# Patient Record
Sex: Male | Born: 1980 | Race: Black or African American | Hispanic: No | Marital: Single | Smoking: Current every day smoker
Health system: Southern US, Community
[De-identification: ages and names within clinical notes are randomized; demographics above are authoritative.]

## PROBLEM LIST (undated history)

## (undated) DIAGNOSIS — N200 Calculus of kidney: Secondary | ICD-10-CM

## (undated) DIAGNOSIS — I10 Essential (primary) hypertension: Secondary | ICD-10-CM

## (undated) DIAGNOSIS — K8020 Calculus of gallbladder without cholecystitis without obstruction: Secondary | ICD-10-CM

---

## 2000-09-19 ENCOUNTER — Emergency Department (HOSPITAL_COMMUNITY): Admission: EM | Admit: 2000-09-19 | Discharge: 2000-09-19 | Payer: Self-pay | Admitting: Emergency Medicine

## 2016-08-09 ENCOUNTER — Ambulatory Visit (INDEPENDENT_AMBULATORY_CARE_PROVIDER_SITE_OTHER): Payer: Self-pay | Admitting: Physician Assistant

## 2018-07-17 ENCOUNTER — Emergency Department (HOSPITAL_COMMUNITY): Payer: Self-pay

## 2018-07-17 ENCOUNTER — Other Ambulatory Visit: Payer: Self-pay

## 2018-07-17 ENCOUNTER — Encounter (HOSPITAL_COMMUNITY): Payer: Self-pay | Admitting: Emergency Medicine

## 2018-07-17 ENCOUNTER — Emergency Department (HOSPITAL_COMMUNITY)
Admission: EM | Admit: 2018-07-17 | Discharge: 2018-07-17 | Disposition: A | Discharge status: Home / Self Care | Payer: Self-pay | Attending: Emergency Medicine | Admitting: Emergency Medicine

## 2018-07-17 DIAGNOSIS — R300 Dysuria: Secondary | ICD-10-CM | POA: Insufficient documentation

## 2018-07-17 DIAGNOSIS — N433 Hydrocele, unspecified: Secondary | ICD-10-CM | POA: Insufficient documentation

## 2018-07-17 DIAGNOSIS — R3915 Urgency of urination: Secondary | ICD-10-CM | POA: Insufficient documentation

## 2018-07-17 DIAGNOSIS — N451 Epididymitis: Secondary | ICD-10-CM | POA: Insufficient documentation

## 2018-07-17 DIAGNOSIS — F1990 Other psychoactive substance use, unspecified, uncomplicated: Secondary | ICD-10-CM | POA: Insufficient documentation

## 2018-07-17 DIAGNOSIS — F17210 Nicotine dependence, cigarettes, uncomplicated: Secondary | ICD-10-CM | POA: Insufficient documentation

## 2018-07-17 DIAGNOSIS — F1290 Cannabis use, unspecified, uncomplicated: Secondary | ICD-10-CM | POA: Insufficient documentation

## 2018-07-17 DIAGNOSIS — I10 Essential (primary) hypertension: Secondary | ICD-10-CM | POA: Insufficient documentation

## 2018-07-17 DIAGNOSIS — R369 Urethral discharge, unspecified: Secondary | ICD-10-CM | POA: Insufficient documentation

## 2018-07-17 HISTORY — DX: Essential (primary) hypertension: I10

## 2018-07-17 LAB — URINALYSIS, ROUTINE W REFLEX MICROSCOPIC
Bilirubin Urine: NEGATIVE
Glucose, UA: NEGATIVE mg/dL
Ketones, ur: NEGATIVE mg/dL
Nitrite: NEGATIVE
Protein, ur: NEGATIVE mg/dL
Specific Gravity, Urine: 1.019 (ref 1.005–1.030)
WBC, UA: >50 WBC/hpf — ABNORMAL HIGH (ref 0–5)
pH: 6 (ref 5.0–8.0)

## 2018-07-17 MED ORDER — CEFTRIAXONE SODIUM 250 MG IJ SOLR
250.0000 mg | Freq: Once | INTRAMUSCULAR | Status: AC
  Administered 2018-07-17: 250 mg via INTRAMUSCULAR
  Filled 2018-07-17: qty 250

## 2018-07-17 MED ORDER — ACETAMINOPHEN 500 MG PO TABS
1000.0000 mg | ORAL_TABLET | Freq: Once | ORAL | Status: AC
  Administered 2018-07-17: 1000 mg via ORAL
  Filled 2018-07-17: qty 2

## 2018-07-17 MED ORDER — DOXYCYCLINE HYCLATE 100 MG PO TABS
100.0000 mg | ORAL_TABLET | Freq: Once | ORAL | Status: AC
  Administered 2018-07-17: 11:00:00 100 mg via ORAL
  Filled 2018-07-17: qty 1

## 2018-07-17 MED ORDER — LIDOCAINE HCL (PF) 1 % IJ SOLN
INTRAMUSCULAR | Status: AC
  Administered 2018-07-17: 11:00:00
  Filled 2018-07-17: qty 5

## 2018-07-17 MED ORDER — DOXYCYCLINE HYCLATE 100 MG PO CAPS: 100.0000 mg | ORAL_CAPSULE | Freq: Two times a day (BID) | ORAL | 0 refills | Status: DC

## 2018-07-17 NOTE — ED Provider Notes (Signed)
[facility] Provider Note   CSN: [id number] Arrival date & time: 07/17/18  [id number]     History   Chief Complaint Chief Complaint  Patient presents with  . Testicle Pain    HPI Anthony Preston is a 38 y.o. male.     HPI  Patient is a 37-year male with past medical history of hypertension presenting for right scrotal swelling and pain.  He reports that his right scrotum has hurt for approximately a week.  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  He also reports dysuria, urgency, and penile discharge.  Patient reports that he has a new sexual partner within the last month.  He reports sexual intercourse with male partners, denies any penetrative anal intercourse.  He denies any fever, chills, nausea, vomiting, rectal pain or swelling.  Patient ports he does have a history of STI.    Past Medical History:  Diagnosis Date  . Hypertension     There are no active problems to display for this patient.   History reviewed. No pertinent surgical history.      Home Medications    Prior to Admission medications   Medication Sig Start Date End Date Taking? Authorizing Provider  doxycycline (VIBRAMYCIN) 100 MG capsule Take 1 capsule (100 mg total) by mouth 2 (two) times daily. 07/17/18   Elisha PonderMurray, Spruha Weight B, PA-C    Family History History reviewed. No pertinent family history.  Social History Social History   Tobacco Use  . Smoking status: Current Every Day Smoker  . Smokeless tobacco: Never Used  Substance Use Topics  . Alcohol use: Yes  . Drug use: Yes    Types: Methamphetamines, Marijuana     Allergies   Patient has no known allergies.   Review of Systems Review of Systems  Constitutional: Negative for chills and fever.  Gastrointestinal: Negative for abdominal pain, nausea and vomiting.  Genitourinary: Positive for discharge, dysuria, scrotal swelling, testicular pain and urgency. Negative for  flank pain.     Physical Exam Updated Vital Signs BP (!) 148/112 (BP Location: Right Arm)   Pulse 90   Temp 98.3 F (36.8 C) (Oral)   Resp 18   Ht 5\' 5"  (1.651 m)   Wt 70.3 kg   SpO2 100%   BMI 25.79 kg/m   Physical Exam Vitals signs and nursing note reviewed.  Constitutional:      General: He is not in acute distress.    Appearance: He is well-developed. He is not diaphoretic.     Comments: Sitting comfortably in bed.  HENT:     Head: Normocephalic and atraumatic.  Eyes:     General:        Right eye: No discharge.        Left eye: No discharge.     Conjunctiva/sclera: Conjunctivae normal.     Comments: EOMs normal to gross examination.  Neck:     Musculoskeletal: Normal range of motion.  Cardiovascular:     Rate and Rhythm: Normal rate and regular rhythm.     Comments: Intact, 2+ radial pulse. Pulmonary:     Comments: Converses comfortably.  No audible wheeze or stridor. Abdominal:     General: There is no distension.  Genitourinary:    Comments: GU examination performed with RN chaperone present.  Patient has an edematous and painful right scrotum.  It is nonerythematous.  Difficult to elicit cremasteric reflex on right.  Left testicle not swollen or erythematous.  Cremasteric reflex intact on left scrotum. Musculoskeletal: Normal range of motion.  Skin:    General: Skin is warm and dry.  Neurological:     Mental Status: He is alert.     Comments: Cranial nerves intact to gross observation. Patient moves extremities without difficulty.  Psychiatric:        Behavior: Behavior normal.        Thought Content: Thought content normal.        Judgment: Judgment normal.      ED Treatments / Results  Labs (all labs ordered are listed, but only abnormal results are displayed) Labs Reviewed  URINALYSIS, ROUTINE W REFLEX MICROSCOPIC - Abnormal; Notable for the following components:      Result Value   APPearance HAZY (*)    Hgb urine dipstick SMALL (*)     Leukocytes,Ua LARGE (*)    WBC, UA >50 (*)    Bacteria, UA RARE (*)    All other components within normal limits  URINE CULTURE  RPR  HIV ANTIBODY (ROUTINE TESTING W REFLEX)  GC/CHLAMYDIA PROBE AMP ([place] NOT [facility]    EKG    Radiology [place] Scrotum W/doppler  Result Date: 07/17/2018 CLINICAL DATA:  Right testicular swelling. EXAM: SCROTAL ULTRASOUND DOPPLER ULTRASOUND OF THE TESTICLES TECHNIQUE: Complete ultrasound examination of the testicles, epididymis, and other scrotal structures was performed. Color and spectral Doppler ultrasound were also utilized to evaluate blood flow to the testicles. COMPARISON:  None. FINDINGS: Right testicle Measurements: 4.5 x 3.0 x 2.8 cm. No mass or microlithiasis visualized. Left testicle Measurements: 4.8 x 2.8 x 2.7 cm. No mass or microlithiasis visualized. Right epididymis: Enlarged and slightly hypervascular suggesting epididymitis. Left epididymis:  Normal in size and appearance. Hydrocele:  Mild right hydrocele is noted. Varicocele:  None visualized. Pulsed Doppler interrogation of both testes demonstrates normal low resistance arterial and venous waveforms bilaterally. IMPRESSION: No evidence of testicular mass or torsion. Enlargement of right epididymis is noted suggesting epididymitis. Mild right hydrocele is noted. Short-term follow-up ultrasound is recommended to ensure resolution. Electronically Signed   By: Lupita RaiderJames  Green Jr M.D.   On: 07/17/2018 10:06    Procedures Procedures (including critical care time)  Medications Ordered in ED Medications  lidocaine (PF) (XYLOCAINE) 1 % injection (has no administration in time range)  acetaminophen (TYLENOL) tablet 1,000 mg (1,000 mg Oral Given 07/17/18 1115)  cefTRIAXone (ROCEPHIN) injection 250 mg (250 mg Intramuscular Given 07/17/18 1115)  doxycycline (VIBRA-TABS) tablet 100 mg (100 mg Oral Given 07/17/18 1115)     Initial Impression / Assessment and Plan / ED Course  I have reviewed the  triage vital signs and the nursing notes.  Pertinent labs & imaging results that were available during my care of the patient were reviewed by me and considered in my medical decision making (see chart for details).        This is a well-appearing 38 year old male with past medical history of hypertension presenting for right scrotal pain and swelling.  Differential diagnosis includes epididymitis versus torsion versus hydrocele.  Clinically, history not consistent with torsion.  More consistent with epididymitis.  Urinalysis confirming infection, likely caused by STI, and Doppler scrotum demonstrates no evidence of torsion but does show hyperemia consistent with epididymitis.  He also has a mild hydrocele likely reactive, but follow-up ultrasound recommended.  Patient was also counseled against substance use and the deleterious effects of meth on the vasculature.  He states that he only uses this occasionally.  He reports that  he is willing and able to quit.  Patient treated with Rocephin and doxycycline.  He is instructed to follow-up with urology.  He is also instructed to obtain recheck for high blood pressure.  Return precautions given for any increasing pain, swelling, nausea, vomiting, rectal pain.  Patient is in understanding and agrees with plan of care.  Final Clinical Impressions(s) / ED Diagnoses   Final diagnoses:  Epididymitis  Hydrocele, unspecified hydrocele type    ED Discharge Orders         Ordered    doxycycline (VIBRAMYCIN) 100 MG capsule  2 times daily     07/17/18 [address] PA-C 07/17/18 [place], [place], DO 07/17/18 1354

## 2018-07-17 NOTE — ED Triage Notes (Addendum)
Pt arrives to ED from home with complaints of testicle swelling, painful and longer lasting erections, and brown discharge for over a week. Pt states he has a new partner but his last partner may of had something. Pt states he does meth before he has intercourse which causes his longer erections and he thinks it causing his "blue balls."

## 2018-07-17 NOTE — ED Notes (Signed)
Patient verbalizes understanding of discharge instructions. Opportunity for questioning and answers were provided. Armband removed by staff, pt discharged from ED.  

## 2018-07-17 NOTE — Discharge Instructions (Signed)
Please see the information and instructions below regarding your visit.  Your diagnoses today include:  1. Epididymitis   2. Hydrocele, unspecified hydrocele type     Tests performed today include: See side panel of your discharge paperwork for testing performed today. Vital signs are listed at the bottom of these instructions.   We tested for HIV and syphilis today.  These results will be available in 48 to 72 hours.  You can find them on your MyChart, or you will receive a call for any positive results.  You will not receive a call for any negative results.   Medications prescribed:    Take any prescribed medications only as prescribed, and any over the counter medications only as directed on the packaging.  Doxycycline is an antibiotic that fights infection in the testicle. This medication can make your skin sensitive to the sun, so please ensure that you wear sunscreen, hats, or other coverage over your skin while taking this. This medicine CANNOT be taken by women while pregnant, breastfeeding, or trying to become pregnant.  Please speak with a healthcare provider if any of these situations apply to you.  You may take Tylenol, [phone] mg every 6 hours as needed for pain.  Home care instructions:  Please follow any educational materials contained in this packet.   Your partner must get tested and treated.  Follow-up instructions: Please follow-up with Dr. Junious Silk of urology.   Return instructions:  Please return to the Emergency Department if you experience worsening symptoms.  Please come back to the ER if you develop any worsening pain, swelling, nausea, vomiting, fever, or rectal pain. Please return if you have any other emergent concerns.  Additional Information:   Your vital signs today were: BP (!) 148/112 (BP Location: Right Arm)    Pulse 90    Temp 98.3 F (36.8 C) (Oral)    Resp 18    Ht 5\' 5"  (1.651 m)    Wt 70.3 kg    SpO2 100%    BMI 25.79 kg/m  If your  blood pressure (BP) was elevated on multiple readings during this visit above 130 for the top number or above 80 for the bottom number, please have this repeated by your primary care provider within one month. --------------  Thank you for allowing [place] to participate in your care today.

## 2018-07-18 LAB — HIV ANTIBODY (ROUTINE TESTING W REFLEX): HIV Screen 4th Generation wRfx: NONREACTIVE

## 2018-07-18 LAB — URINE CULTURE: Culture: NO GROWTH

## 2018-07-18 LAB — GC/CHLAMYDIA PROBE AMP (~~LOC~~) NOT AT ARMC
Chlamydia: NEGATIVE
Neisseria Gonorrhea: NEGATIVE

## 2018-07-19 LAB — RPR: RPR Ser Ql: NONREACTIVE

## 2018-09-23 ENCOUNTER — Emergency Department (HOSPITAL_COMMUNITY)
Admission: EM | Admit: 2018-09-23 | Discharge: 2018-09-23 | Disposition: A | Discharge status: Home / Self Care | Payer: Self-pay | Attending: Emergency Medicine | Admitting: Emergency Medicine

## 2018-09-23 DIAGNOSIS — F1210 Cannabis abuse, uncomplicated: Secondary | ICD-10-CM | POA: Insufficient documentation

## 2018-09-23 DIAGNOSIS — S61216A Laceration without foreign body of right little finger without damage to nail, initial encounter: Secondary | ICD-10-CM | POA: Insufficient documentation

## 2018-09-23 DIAGNOSIS — Y281XXA Contact with knife, undetermined intent, initial encounter: Secondary | ICD-10-CM | POA: Insufficient documentation

## 2018-09-23 DIAGNOSIS — Y999 Unspecified external cause status: Secondary | ICD-10-CM | POA: Insufficient documentation

## 2018-09-23 DIAGNOSIS — I10 Essential (primary) hypertension: Secondary | ICD-10-CM | POA: Insufficient documentation

## 2018-09-23 DIAGNOSIS — F17210 Nicotine dependence, cigarettes, uncomplicated: Secondary | ICD-10-CM | POA: Insufficient documentation

## 2018-09-23 DIAGNOSIS — Z23 Encounter for immunization: Secondary | ICD-10-CM | POA: Insufficient documentation

## 2018-09-23 DIAGNOSIS — Y939 Activity, unspecified: Secondary | ICD-10-CM | POA: Insufficient documentation

## 2018-09-23 DIAGNOSIS — Y929 Unspecified place or not applicable: Secondary | ICD-10-CM | POA: Insufficient documentation

## 2018-09-23 MED ORDER — TETANUS-DIPHTH-ACELL PERTUSSIS 5-2.5-18.5 LF-MCG/0.5 IM SUSP
0.5000 mL | Freq: Once | INTRAMUSCULAR | Status: AC
  Administered 2018-09-23: 15:00:00 0.5 mL via INTRAMUSCULAR
  Filled 2018-09-23: qty 0.5

## 2018-09-23 MED ORDER — LIDOCAINE HCL (PF) 1 % IJ SOLN
10.0000 mL | Freq: Once | INTRAMUSCULAR | Status: AC
  Administered 2018-09-23: 10 mL
  Filled 2018-09-23: qty 30

## 2018-09-23 NOTE — ED Provider Notes (Signed)
[facility]-EMERGENCY DEPT Provider Note   CSN: [id number] Arrival date & time: 09/23/18  1234     History   Chief Complaint Chief Complaint  Patient presents with  . Laceration    HPI Anthony Preston is a 38 y.o. male.     Patient is a 38 year old male with past medical history of hypertension presenting to the emergency department for a laceration.  Patient reports just prior to arrival he was reaching into his backpack with his right hand when he cut his right little finger on a knife which was opened in his backpack.  Reports having pain to his finger.  Patient called EMS and was brought in.  Patient reports that he recently had a tetanus shot in the last couple of years.     Past Medical History:  Diagnosis Date  . Hypertension     There are no active problems to display for this patient.   No past surgical history on file.      Home Medications    Prior to Admission medications   Medication Sig Start Date End Date Taking? Authorizing Provider  doxycycline (VIBRAMYCIN) 100 MG capsule Take 1 capsule (100 mg total) by mouth 2 (two) times daily. 07/17/18   Elisha PonderMurray, Alyssa B, PA-C    Family History No family history on file.  Social History Social History   Tobacco Use  . Smoking status: Current Every Day Smoker  . Smokeless tobacco: Never Used  Substance Use Topics  . Alcohol use: Yes  . Drug use: Yes    Types: Methamphetamines, Marijuana     Allergies   Patient has no known allergies.   Review of Systems Review of Systems  Constitutional: Negative for chills and fever.  Skin: Positive for wound.  Allergic/Immunologic: Negative for immunocompromised state.  Hematological: Does not bruise/bleed easily.  All other systems reviewed and are negative.    Physical Exam Updated Vital Signs BP 122/87   Pulse (!) 112   Temp (!) 97.4 F (36.3 C) (Oral)   Resp 18   SpO2 100%   Physical Exam Vitals signs and nursing note  reviewed.  Constitutional:      General: He is not in acute distress.    Appearance: Normal appearance. He is not ill-appearing, toxic-appearing or diaphoretic.  HENT:     Head: Normocephalic and atraumatic.  Eyes:     Conjunctiva/sclera: Conjunctivae normal.  Pulmonary:     Effort: Pulmonary effort is normal.  Musculoskeletal:     Comments: Small 1 cm laceration to the distal lateral portion of the finger without involvement of the nail.  Bleeding is controlled.  Range of motion and sensation are intact  Skin:    General: Skin is dry.     Capillary Refill: Capillary refill takes less than 2 seconds.  Neurological:     Mental Status: He is alert.  Psychiatric:        Mood and Affect: Mood normal.      ED Treatments / Results  Labs (all labs ordered are listed, but only abnormal results are displayed) Labs Reviewed - No data to display  EKG None  Radiology No results found.  Procedures .Marland Kitchen.Laceration Repair  Date/Time: 09/23/2018 2:27 PM Performed by: Arlyn DunningMcLean, Eliud Polo A, PA-C Authorized by: Arlyn DunningMcLean, Dorena Dorfman A, PA-C   Consent:    Consent obtained:  Verbal   Consent given by:  Patient   Risks discussed:  Infection, pain, poor cosmetic result, need for additional repair, nerve damage, tendon damage  and poor wound healing   Alternatives discussed:  No treatment and observation Anesthesia (see MAR for exact dosages):    Anesthesia method:  Nerve block   Block location:  Right little finger digital block   Block needle gauge:  25 G   Block anesthetic:  Lidocaine 1% w/o epi   Block injection procedure:  Anatomic landmarks identified, anatomic landmarks palpated, introduced needle, negative aspiration for blood and incremental injection   Block outcome:  Anesthesia achieved Laceration details:    Location:  Finger   Finger location:  R small finger   Length (cm):  1 Repair type:    Repair type:  Simple Pre-procedure details:    Preparation:  Patient was prepped and draped in  usual sterile fashion Exploration:    Hemostasis achieved with:  Direct pressure   Wound exploration: wound explored through full range of motion     Contaminated: no   Treatment:    Area cleansed with:  Betadine and saline   Amount of cleaning:  Standard   Irrigation solution:  Sterile saline   Irrigation method:  Syringe Skin repair:    Repair method:  Sutures   Suture size:  4-0   Suture material:  Nylon   Suture technique:  Simple interrupted   Number of sutures:  5 Approximation:    Approximation:  Close Post-procedure details:    Dressing:  Non-adherent dressing   Patient tolerance of procedure:  Tolerated well, no immediate complications   (including critical care time)  Medications Ordered in ED Medications  Tdap (BOOSTRIX) injection 0.5 mL (has no administration in time range)  lidocaine (PF) (XYLOCAINE) 1 % injection 10 mL (10 mLs Infiltration Given by Other 09/23/18 1401)     Initial Impression / Assessment and Plan / ED Course  I have reviewed the triage vital signs and the nursing notes.  Pertinent labs & imaging results that were available during my care of the patient were reviewed by me and considered in my medical decision making (see chart for details).          Final Clinical Impressions(s) / ED Diagnoses   Final diagnoses:  Laceration of right little finger without foreign body without damage to nail, initial encounter    ED Discharge Orders    None       Kristine Royal 09/23/18 1428    Little, Wenda Overland, MD 09/24/18 [id number]

## 2018-09-23 NOTE — ED Triage Notes (Signed)
Per [facility] pt from hotel for laceration on 5th finger on right side with knife that was open in his bag.  Vitals: 120/70, 125HR, 22R, 96% on RA 97 temp.

## 2018-09-23 NOTE — Discharge Instructions (Addendum)
Thank you for allowing me to care for you today. Please return to the emergency department if you have new or worsening symptoms. Take your medications as instructed.  ° °

## 2018-09-23 NOTE — ED Notes (Signed)
PA at bedside, unable to complete triage at this time.

## 2018-12-01 ENCOUNTER — Emergency Department (HOSPITAL_COMMUNITY)
Admission: EM | Admit: 2018-12-01 | Discharge: 2018-12-01 | Disposition: A | Discharge status: Home / Self Care | Payer: Self-pay | Attending: Emergency Medicine | Admitting: Emergency Medicine

## 2018-12-01 ENCOUNTER — Other Ambulatory Visit: Payer: Self-pay

## 2018-12-01 DIAGNOSIS — F17210 Nicotine dependence, cigarettes, uncomplicated: Secondary | ICD-10-CM | POA: Insufficient documentation

## 2018-12-01 DIAGNOSIS — U071 COVID-19: Secondary | ICD-10-CM | POA: Insufficient documentation

## 2018-12-01 DIAGNOSIS — B349 Viral infection, unspecified: Secondary | ICD-10-CM

## 2018-12-01 DIAGNOSIS — I10 Essential (primary) hypertension: Secondary | ICD-10-CM | POA: Insufficient documentation

## 2018-12-01 NOTE — Discharge Instructions (Addendum)
You will only be called if your COVID-19 test is positive.  You can check results on MyChart.  Please return to the emergency department if you develop any new or worsening symptoms.

## 2018-12-01 NOTE — ED Provider Notes (Signed)
[facility] EMERGENCY DEPARTMENT Provider Note   CSN: [id number] Arrival date & time: 12/01/18  1511     History   Chief Complaint No chief complaint on file.   HPI Anthony Preston is a 38 y.o. male with history of HTN who presents with a 1 day history of body aches, fatigue, chills, and nasal congestion. He denies any cough, chest pain, shortness of breath, vomiting, diarrhea, loss of taste or smell. He notes he was nauseated yesterday, but this resolved. He has not taken any medications at home for his symptoms. He requests Covid swab.     HPI  Past Medical History:  Diagnosis Date  . Hypertension     There are no active problems to display for this patient.   No past surgical history on file.      Home Medications    Prior to Admission medications   Medication Sig Start Date End Date Taking? Authorizing Provider  doxycycline (VIBRAMYCIN) 100 MG capsule Take 1 capsule (100 mg total) by mouth 2 (two) times daily. 07/17/18   Elisha Ponder, PA-C    Family History No family history on file.  Social History Social History   Tobacco Use  . Smoking status: Current Every Day Smoker  . Smokeless tobacco: Never Used  Substance Use Topics  . Alcohol use: Yes  . Drug use: Yes    Types: Methamphetamines, Marijuana     Allergies   Patient has no known allergies.   Review of Systems Review of Systems  Constitutional: Positive for chills and fatigue. Negative for fever.  HENT: Positive for congestion.   Musculoskeletal: Positive for myalgias.     Physical Exam Updated Vital Signs BP (!) 144/101 (BP Location: Right Arm)   Pulse 90   Temp 99 F (37.2 C) (Oral)   Resp 16   SpO2 99%   Physical Exam Vitals signs and nursing note reviewed.  Constitutional:      General: He is not in acute distress.    Appearance: He is well-developed. He is not diaphoretic.  HENT:     Head: Normocephalic and atraumatic.     Nose: Congestion present.     Mouth/Throat:     Pharynx: No oropharyngeal exudate.  Eyes:     General: No scleral icterus.       Right eye: No discharge.        Left eye: No discharge.     Conjunctiva/sclera: Conjunctivae normal.     Pupils: Pupils are equal, round, and reactive to light.  Neck:     Musculoskeletal: Normal range of motion and neck supple. No neck rigidity.     Thyroid: No thyromegaly.  Cardiovascular:     Rate and Rhythm: Normal rate and regular rhythm.     Heart sounds: Normal heart sounds. No murmur. No friction rub. No gallop.   Pulmonary:     Effort: Pulmonary effort is normal. No respiratory distress.     Breath sounds: Normal breath sounds. No stridor. No wheezing or rales.  Abdominal:     General: Bowel sounds are normal. There is no distension.     Palpations: Abdomen is soft.     Tenderness: There is no abdominal tenderness. There is no guarding or rebound.  Lymphadenopathy:     Cervical: No cervical adenopathy.  Skin:    General: Skin is warm and dry.     Coloration: Skin is not pale.     Findings: No rash.  Neurological:  Mental Status: He is alert.     Coordination: Coordination normal.      ED Treatments / Results  Labs (all labs ordered are listed, but only abnormal results are displayed) Labs Reviewed  SARS CORONAVIRUS 2 (TAT 6-24 HRS)    EKG None  Radiology No results found.  Procedures Procedures (including critical care time)  Medications Ordered in ED Medications - No data to display   Initial Impression / Assessment and Plan / ED Course  I have reviewed the triage vital signs and the nursing notes.  Pertinent labs & imaging results that were available during my care of the patient were reviewed by me and considered in my medical decision making (see chart for details).        Patient with probable viral syndrome, with chills and nasal congestion. Lungs clear. Vitals are stable. Covid swab pending. No indication for further workup at this  time. Isolation at home discussed. Patient understands and agrees with plan. Patient vitals stable throughout ED course and discharged in satisfactory condition.  Anthony Preston was evaluated in Emergency Department on 12/01/2018 for the symptoms described in the history of present illness. He was evaluated in the context of the global COVID-19 pandemic, which necessitated consideration that the patient might be at risk for infection with the SARS-CoV-2 virus that causes COVID-19. Institutional protocols and algorithms that pertain to the evaluation of patients at risk for COVID-19 are in a state of rapid change based on information released by regulatory bodies including the CDC and federal and state organizations. These policies and algorithms were followed during the patient's care in the ED.   Final Clinical Impressions(s) / ED Diagnoses   Final diagnoses:  Viral syndrome    ED Discharge Orders    None       Caryl Ada 12/01/18 2117    Carmin Muskrat, MD 12/01/18 2157

## 2018-12-01 NOTE — ED Triage Notes (Signed)
Patient complains of general weakness and fatigue x 1 day with chills. Patient alert and oriented, NAD

## 2018-12-02 LAB — SARS CORONAVIRUS 2 (TAT 6-24 HRS): SARS Coronavirus 2: POSITIVE — AB

## 2018-12-03 ENCOUNTER — Emergency Department (HOSPITAL_COMMUNITY)
Admission: EM | Admit: 2018-12-03 | Discharge: 2018-12-03 | Disposition: A | Discharge status: Home / Self Care | Payer: Self-pay | Attending: Emergency Medicine | Admitting: Emergency Medicine

## 2018-12-03 ENCOUNTER — Emergency Department (HOSPITAL_COMMUNITY): Payer: Self-pay

## 2018-12-03 ENCOUNTER — Encounter (HOSPITAL_COMMUNITY): Payer: Self-pay | Admitting: Emergency Medicine

## 2018-12-03 DIAGNOSIS — F1510 Other stimulant abuse, uncomplicated: Secondary | ICD-10-CM | POA: Insufficient documentation

## 2018-12-03 DIAGNOSIS — F1210 Cannabis abuse, uncomplicated: Secondary | ICD-10-CM | POA: Insufficient documentation

## 2018-12-03 DIAGNOSIS — I10 Essential (primary) hypertension: Secondary | ICD-10-CM | POA: Insufficient documentation

## 2018-12-03 DIAGNOSIS — U071 COVID-19: Secondary | ICD-10-CM | POA: Insufficient documentation

## 2018-12-03 DIAGNOSIS — Z79899 Other long term (current) drug therapy: Secondary | ICD-10-CM | POA: Insufficient documentation

## 2018-12-03 DIAGNOSIS — R0602 Shortness of breath: Secondary | ICD-10-CM

## 2018-12-03 DIAGNOSIS — F17200 Nicotine dependence, unspecified, uncomplicated: Secondary | ICD-10-CM | POA: Insufficient documentation

## 2018-12-03 LAB — CBC WITH DIFFERENTIAL/PLATELET
Abs Immature Granulocytes: 0.02 10*3/uL (ref 0.00–0.07)
Basophils Absolute: 0 10*3/uL (ref 0.0–0.1)
Basophils Relative: 1 %
Eosinophils Absolute: 0 10*3/uL (ref 0.0–0.5)
Eosinophils Relative: 0 %
HCT: 46.1 % (ref 39.0–52.0)
Hemoglobin: 15.6 g/dL (ref 13.0–17.0)
Immature Granulocytes: 0 %
Lymphocytes Relative: 15 %
Lymphs Abs: 0.9 10*3/uL (ref 0.7–4.0)
MCH: 25 pg — ABNORMAL LOW (ref 26.0–34.0)
MCHC: 33.8 g/dL (ref 30.0–36.0)
MCV: 73.9 fL — ABNORMAL LOW (ref 80.0–100.0)
Monocytes Absolute: 0.1 10*3/uL (ref 0.1–1.0)
Monocytes Relative: 2 %
Neutro Abs: 4.8 10*3/uL (ref 1.7–7.7)
Neutrophils Relative %: 82 %
Platelets: 372 10*3/uL (ref 150–400)
RBC: 6.24 MIL/uL — ABNORMAL HIGH (ref 4.22–5.81)
RDW: 14.6 % (ref 11.5–15.5)
WBC: 5.8 10*3/uL (ref 4.0–10.5)
nRBC: 0 % (ref 0.0–0.2)

## 2018-12-03 LAB — COMPREHENSIVE METABOLIC PANEL
ALT: 15 U/L (ref 0–44)
AST: 28 U/L (ref 15–41)
Albumin: 3.5 g/dL (ref 3.5–5.0)
Alkaline Phosphatase: 64 U/L (ref 38–126)
Anion gap: 6 (ref 5–15)
BUN: 9 mg/dL (ref 6–20)
CO2: 29 mmol/L (ref 22–32)
Calcium: 8.5 mg/dL — ABNORMAL LOW (ref 8.9–10.3)
Chloride: 103 mmol/L (ref 98–111)
Creatinine, Ser: 0.99 mg/dL (ref 0.61–1.24)
GFR calc Af Amer: >60 mL/min (ref >60)
GFR calc non Af Amer: >60 mL/min (ref >60)
Glucose, Bld: 111 mg/dL — ABNORMAL HIGH (ref 70–99)
Potassium: 4.7 mmol/L (ref 3.5–5.1)
Sodium: 138 mmol/L (ref 135–145)
Total Bilirubin: 1.1 mg/dL (ref 0.3–1.2)
Total Protein: 6.8 g/dL (ref 6.5–8.1)

## 2018-12-03 LAB — TRIGLYCERIDES: Triglycerides: 114 mg/dL (ref <150)

## 2018-12-03 LAB — C-REACTIVE PROTEIN: CRP: 2.1 mg/dL — ABNORMAL HIGH (ref <1.0)

## 2018-12-03 LAB — FERRITIN: Ferritin: 118 ng/mL (ref 24–336)

## 2018-12-03 LAB — D-DIMER, QUANTITATIVE: D-Dimer, Quant: 0.79 ug/mL-FEU — ABNORMAL HIGH (ref 0.00–0.50)

## 2018-12-03 LAB — FIBRINOGEN: Fibrinogen: 640 mg/dL — ABNORMAL HIGH (ref 210–475)

## 2018-12-03 LAB — LACTIC ACID, PLASMA: Lactic Acid, Venous: 1.2 mmol/L (ref 0.5–1.9)

## 2018-12-03 LAB — LACTATE DEHYDROGENASE: LDH: 272 U/L — ABNORMAL HIGH (ref 98–192)

## 2018-12-03 MED ORDER — BENZONATATE 100 MG PO CAPS: 100.0000 mg | ORAL_CAPSULE | Freq: Three times a day (TID) | ORAL | 0 refills | Status: DC | PRN

## 2018-12-03 MED ORDER — FLUTICASONE PROPIONATE 50 MCG/ACT NA SUSP: 2.0000 | Freq: Every day | NASAL | 0 refills | Status: DC

## 2018-12-03 MED ORDER — PREDNISONE 10 MG PO TABS: 40.0000 mg | ORAL_TABLET | Freq: Every day | ORAL | 0 refills | Status: AC

## 2018-12-03 MED ORDER — ALBUTEROL SULFATE HFA 108 (90 BASE) MCG/ACT IN AERS
1.0000 | INHALATION_SPRAY | Freq: Once | RESPIRATORY_TRACT | Status: AC
  Administered 2018-12-03: 1 via RESPIRATORY_TRACT
  Filled 2018-12-03: qty 6.7

## 2018-12-03 NOTE — ED Notes (Addendum)
Lab called procalcitonin hemolyzed, lab to cancel order.  [place], [place], per PA no need to re-draw, as pt will be discharged.

## 2018-12-03 NOTE — ED Notes (Signed)
1st set of BC drawn by [place] RN @ 1310 2nd set BC drawn by [place] @ 1325 via [place]

## 2018-12-03 NOTE — ED Notes (Signed)
Pt's oxygen saturation remained at 100% while ambulating.

## 2018-12-03 NOTE — Discharge Instructions (Addendum)
Use the albuterol inhaler 1 to 2 puffs every 4-6 hours as needed for shortness of breath.  Take prednisone beginning tomorrow.  You received the first dose today.  Take Tessalon as needed for cough.  Flonase for nasal congestion.  Can plenty of fluids and get plenty of rest.  You can take 1 to 2 tablets of Tylenol every 4-6 hours as needed for fever pain.  Do not exceed more than 4000 mg of Tylenol daily.  Your work-up today was reassuring and your oxygen levels are normal today.  You may find it helpful to buy a pulse oximeter to check your oxygen levels at home.  If your oxygen levels dropped below 90% seek emergency evaluation immediately.  Follow-up with primary care for reevaluation of symptoms.  Return to the emergency department if any concerning signs or symptoms develop such as low oxygen levels, shortness of breath, chest pain, persistent vomiting, or loss of consciousness.

## 2018-12-03 NOTE — ED Provider Notes (Signed)
[place] EMERGENCY DEPARTMENT Provider Note   CSN: [id number] Arrival date & time: 12/03/18  1034     History   Chief Complaint Chief Complaint  Patient presents with  . Shortness of Breath    HPI Anthony Preston is a 38 y.o. male with history of hypertension presents today brought in by EMS for evaluation of acute onset, progressively worsening shortness of breath.  He reports developing fever, myalgias, nasal congestion, cough productive of yellow-green sputum 4 days ago.  He was seen and evaluated in the ED on 12/01/2018 2 days ago when his symptoms were more mild and was found to be stable for discharge home.  He received outpatient Covid testing which was positive.  Reports that he has been feeling worse over the last 2 days.  Today 1 hour prior to arrival to the ED he began to feel sudden onset shortness of breath while sitting on his couch.  He called EMS who found him to be diaphoretic, tripoding, pale on their arrival.  He received 125 mg Solu-Medrol, 2 g magnesium and was placed on nonrebreather with improvement.  He did not tolerate CPAP.  He was also given 500 cc fluid bolus.  He reports marked improvement with these treatments.  Currently on 2 L nasal cannula with SPO2 saturations 100%.  Denies any associated chest pain, nausea, vomiting, abdominal pain.  He is a current smoker of around half a pack of cigarettes daily, also smokes marijuana daily.     The history is provided by the patient.    Past Medical History:  Diagnosis Date  . Hypertension     There are no active problems to display for this patient.   History reviewed. No pertinent surgical history.      Home Medications    Prior to Admission medications   Medication Sig Start Date End Date Taking? Authorizing Provider  benzonatate (TESSALON) 100 MG capsule Take 1 capsule (100 mg total) by mouth 3 (three) times daily as needed for cough. 12/03/18   Jurney Overacker A, PA-C  doxycycline  (VIBRAMYCIN) 100 MG capsule Take 1 capsule (100 mg total) by mouth 2 (two) times daily. 07/17/18   Langston Masker B, PA-C  fluticasone (FLONASE) 50 MCG/ACT nasal spray Place 2 sprays into both nostrils daily. 12/03/18   Dayveon Halley A, PA-C  predniSONE (DELTASONE) 10 MG tablet Take 4 tablets (40 mg total) by mouth daily with breakfast for 5 days. 12/03/18 12/08/18  Renita Papa, PA-C    Family History History reviewed. No pertinent family history.  Social History Social History   Tobacco Use  . Smoking status: Current Every Day Smoker  . Smokeless tobacco: Never Used  Substance Use Topics  . Alcohol use: Yes  . Drug use: Yes    Types: Methamphetamines, Marijuana     Allergies   Patient has no known allergies.   Review of Systems Review of Systems  Constitutional: Positive for chills and fever.  HENT: Positive for congestion.   Respiratory: Positive for cough and shortness of breath.   Cardiovascular: Negative for chest pain and leg swelling.  Gastrointestinal: Negative for abdominal pain, nausea and vomiting.  All other systems reviewed and are negative.    Physical Exam Updated Vital Signs BP (!) 140/99   Pulse 66   Temp 98.2 F (36.8 C) (Oral)   Resp 15   SpO2 100%   Physical Exam Vitals signs and nursing note reviewed.  Constitutional:      General: He  is not in acute distress.    Appearance: He is well-developed.  HENT:     Head: Normocephalic and atraumatic.  Eyes:     General:        Right eye: No discharge.        Left eye: No discharge.     Conjunctiva/sclera: Conjunctivae normal.  Neck:     Musculoskeletal: Neck supple.     Vascular: No JVD.     Trachea: No tracheal deviation.  Cardiovascular:     Rate and Rhythm: Normal rate.     Pulses: Normal pulses.     Comments: 2+ radial and DP/PT pulses bilaterally, Homans sign absent bilaterally, no lower extremity edema, no palpable cords, compartments are soft  Pulmonary:     Effort: Pulmonary  effort is normal.     Comments: Speaking in full sentences without difficulty, SPO2 saturations 100% on 2 L nasal cannula Abdominal:     General: There is no distension.     Palpations: Abdomen is soft.     Tenderness: There is no abdominal tenderness. There is no guarding or rebound.  Musculoskeletal:     Right lower leg: He exhibits no tenderness. No edema.     Left lower leg: He exhibits no tenderness. No edema.  Skin:    General: Skin is warm and dry.     Findings: No erythema.  Neurological:     Mental Status: He is alert.  Psychiatric:        Behavior: Behavior normal.      ED Treatments / Results  Labs (all labs ordered are listed, but only abnormal results are displayed) Labs Reviewed  CBC WITH DIFFERENTIAL/PLATELET - Abnormal; Notable for the following components:      Result Value   RBC 6.24 (*)    MCV 73.9 (*)    MCH 25.0 (*)    All other components within normal limits  COMPREHENSIVE METABOLIC PANEL - Abnormal; Notable for the following components:   Glucose, Bld 111 (*)    Calcium 8.5 (*)    All other components within normal limits  D-DIMER, QUANTITATIVE (NOT AT [facility]) - Abnormal; Notable for the following components:   D-Dimer, Quant 0.79 (*)    All other components within normal limits  LACTATE DEHYDROGENASE - Abnormal; Notable for the following components:   LDH 272 (*)    All other components within normal limits  FIBRINOGEN - Abnormal; Notable for the following components:   Fibrinogen 640 (*)    All other components within normal limits  C-REACTIVE PROTEIN - Abnormal; Notable for the following components:   CRP 2.1 (*)    All other components within normal limits  CULTURE, BLOOD (ROUTINE X 2)  CULTURE, BLOOD (ROUTINE X 2)  LACTIC ACID, PLASMA  FERRITIN  TRIGLYCERIDES    EKG EKG Interpretation  Date/Time:  Wednesday December 03 2018 10:55:02 EST Ventricular Rate:  73 PR Interval:    QRS Duration: 90 QT Interval:  433 QTC Calculation: 478  R Axis:   71 Text Interpretation: Sinus rhythm Consider left ventricular hypertrophy Borderline prolonged QT interval No old tracing to compare Confirmed by Jacalyn Lefevre [phone]) on 12/03/2018 11:00:03 AM   Radiology Dg Chest Port 1 View  Result Date: 12/03/2018 CLINICAL DATA:  Shortness of breath, hypoxia, COVID positive EXAM: PORTABLE CHEST 1 VIEW COMPARISON:  None. FINDINGS: Lungs are clear.  No pleural effusion or pneumothorax. The heart is normal in size. IMPRESSION: No evidence of acute cardiopulmonary disease. Electronically Signed  By: Charline BillsSriyesh  Krishnan M.D.   On: 12/03/2018 11:20    Procedures Procedures (including critical care time)  Medications Ordered in ED Medications  albuterol (VENTOLIN HFA) 108 (90 Base) MCG/ACT inhaler 1 puff (1 puff Inhalation Given 12/03/18 1533)     Initial Impression / Assessment and Plan / ED Course  I have reviewed the triage vital signs and the nursing notes.  Pertinent labs & imaging results that were available during my care of the patient were reviewed by me and considered in my medical decision making (see chart for details).        Anthony Laundryrthur Lax was evaluated in Emergency Department on 12/03/2018 for the symptoms described in the history of present illness. He was evaluated in the context of the global COVID-19 pandemic, which necessitated consideration that the patient might be at risk for infection with the SARS-CoV-2 virus that causes COVID-19. Institutional protocols and algorithms that pertain to the evaluation of patients at risk for COVID-19 are in a state of rapid change based on information released by regulatory bodies including the CDC and federal and state organizations. These policies and algorithms were followed during the patient's care in the ED.   Patient brought in by EMS for evaluation of shortness of breath today, known Covid positive.  He was on nonrebreather with EMS but was able to be weaned down to 2 L nasal  cannula by the time he arrived to the ED as he responded very well to IV Solu-Medrol and magnesium as well as a 500 cc fluid bolus.  On my assessment he is resting very comfortably no apparent distress, speaking in full sentences without difficulty.  SPO2 saturations were 100% on 2 L nasal cannula he was able to be weaned back down to room air with no difficulty.  He was also ambulated in ED with stable SPO2 saturations.  Chest x-ray today shows no evidence of acute cardiopulmonary abnormalities EKG shows normal sinus rhythm.  Remainder of lab work reviewed by me shows no leukocytosis, no anemia, no metabolic derangements, no renal insufficiency.  His Covid markers were drawn and were elevated.  This does include D-dimer which is mildly elevated but I suspect this is elevated in the setting of COVID-19 infection, doubt PE in the absence of pleuritic chest pain, tachycardia, tachypnea.  Doubt ACS/MI, dissection, cardiac tamponade, esophageal rupture, or pneumothorax.  He was observed for a few hours in the ED with no worsening, tolerating p.o. food and fluids on reevaluation.  Reports he is feeling much better and feels comfortable discharge home.  He is overall quite well-appearing presents.  We discussed quarantining at home per current CDC guidelines, symptomatic management, and I encouraged him to buy a pulse oximeter so that he can check his oxygen levels at home.  We will also give him an albuterol inhaler to take home and will discharge with course of prednisone, Tessalon for cough, Flonase for nasal congestion.  Recommend follow-up with PCP for reevaluation of symptoms.  Discussed strict ED return precautions. Pt verbalized understanding of and agreement with plan and is safe for discharge home at this time.  Discussed with Dr. Particia NearingHaviland who agrees with assessment and plan at this time   Final Clinical Impressions(s) / ED Diagnoses   Final diagnoses:  COVID-19 virus infection  SOB (shortness of breath)     ED Discharge Orders         Ordered    predniSONE (DELTASONE) 10 MG tablet  Daily with breakfast  12/03/18 1517    benzonatate (TESSALON) 100 MG capsule  3 times daily PRN     12/03/18 1517    fluticasone (FLONASE) 50 MCG/ACT nasal spray  Daily     12/03/18 1517           Jeanie Sewer, PA-C 12/03/18 1544    Jacalyn Lefevre, MD 12/03/18 1549

## 2018-12-03 NOTE — ED Triage Notes (Signed)
PT found by EMS to be tripoding and diaphoretic and RR of 40. Pt was here yesterday. EMS gave 125 solumedrol, 2 g mag and nonrebreather. Pt is now been titrated to 2 L [place] on ride and not in respiratory distress. No wheezing noted at this time. Rhonchi head by EMS when first on scene.

## 2018-12-08 LAB — CULTURE, BLOOD (ROUTINE X 2)
Culture: NO GROWTH
Culture: NO GROWTH
Special Requests: ADEQUATE
Special Requests: ADEQUATE

## 2019-01-04 ENCOUNTER — Other Ambulatory Visit: Payer: Self-pay

## 2019-01-04 ENCOUNTER — Emergency Department (HOSPITAL_BASED_OUTPATIENT_CLINIC_OR_DEPARTMENT_OTHER): Payer: Self-pay

## 2019-01-04 ENCOUNTER — Emergency Department (HOSPITAL_BASED_OUTPATIENT_CLINIC_OR_DEPARTMENT_OTHER)
Admission: EM | Admit: 2019-01-04 | Discharge: 2019-01-04 | Disposition: A | Discharge status: Home / Self Care | Payer: Self-pay | Attending: Emergency Medicine | Admitting: Emergency Medicine

## 2019-01-04 ENCOUNTER — Encounter (HOSPITAL_BASED_OUTPATIENT_CLINIC_OR_DEPARTMENT_OTHER): Payer: Self-pay

## 2019-01-04 ENCOUNTER — Telehealth (HOSPITAL_BASED_OUTPATIENT_CLINIC_OR_DEPARTMENT_OTHER): Payer: Self-pay | Admitting: Emergency Medicine

## 2019-01-04 DIAGNOSIS — F1210 Cannabis abuse, uncomplicated: Secondary | ICD-10-CM | POA: Insufficient documentation

## 2019-01-04 DIAGNOSIS — R109 Unspecified abdominal pain: Secondary | ICD-10-CM | POA: Insufficient documentation

## 2019-01-04 DIAGNOSIS — Z79899 Other long term (current) drug therapy: Secondary | ICD-10-CM | POA: Insufficient documentation

## 2019-01-04 DIAGNOSIS — I10 Essential (primary) hypertension: Secondary | ICD-10-CM | POA: Insufficient documentation

## 2019-01-04 DIAGNOSIS — N201 Calculus of ureter: Secondary | ICD-10-CM | POA: Insufficient documentation

## 2019-01-04 DIAGNOSIS — F17210 Nicotine dependence, cigarettes, uncomplicated: Secondary | ICD-10-CM | POA: Insufficient documentation

## 2019-01-04 DIAGNOSIS — F1510 Other stimulant abuse, uncomplicated: Secondary | ICD-10-CM | POA: Insufficient documentation

## 2019-01-04 DIAGNOSIS — N200 Calculus of kidney: Secondary | ICD-10-CM | POA: Insufficient documentation

## 2019-01-04 DIAGNOSIS — M899 Disorder of bone, unspecified: Secondary | ICD-10-CM | POA: Insufficient documentation

## 2019-01-04 LAB — COMPREHENSIVE METABOLIC PANEL
ALT: 24 U/L (ref 0–44)
AST: 29 U/L (ref 15–41)
Albumin: 4.3 g/dL (ref 3.5–5.0)
Alkaline Phosphatase: 68 U/L (ref 38–126)
Anion gap: 12 (ref 5–15)
BUN: 17 mg/dL (ref 6–20)
CO2: 26 mmol/L (ref 22–32)
Calcium: 9.3 mg/dL (ref 8.9–10.3)
Chloride: 101 mmol/L (ref 98–111)
Creatinine, Ser: 1.29 mg/dL — ABNORMAL HIGH (ref 0.61–1.24)
GFR calc Af Amer: >60 mL/min (ref >60)
GFR calc non Af Amer: >60 mL/min (ref >60)
Glucose, Bld: 88 mg/dL (ref 70–99)
Potassium: 3.3 mmol/L — ABNORMAL LOW (ref 3.5–5.1)
Sodium: 139 mmol/L (ref 135–145)
Total Bilirubin: 1.8 mg/dL — ABNORMAL HIGH (ref 0.3–1.2)
Total Protein: 7.8 g/dL (ref 6.5–8.1)

## 2019-01-04 LAB — LIPASE, BLOOD: Lipase: 22 U/L (ref 11–51)

## 2019-01-04 LAB — CBC
HCT: 41.1 % (ref 39.0–52.0)
Hemoglobin: 14 g/dL (ref 13.0–17.0)
MCH: 24.6 pg — ABNORMAL LOW (ref 26.0–34.0)
MCHC: 34.1 g/dL (ref 30.0–36.0)
MCV: 72.4 fL — ABNORMAL LOW (ref 80.0–100.0)
Platelets: 329 10*3/uL (ref 150–400)
RBC: 5.68 MIL/uL (ref 4.22–5.81)
RDW: 14.8 % (ref 11.5–15.5)
WBC: 7.7 10*3/uL (ref 4.0–10.5)
nRBC: 0 % (ref 0.0–0.2)

## 2019-01-04 MED ORDER — KETOROLAC TROMETHAMINE 30 MG/ML IJ SOLN
30.0000 mg | Freq: Once | INTRAMUSCULAR | Status: AC
  Administered 2019-01-04: 30 mg via INTRAVENOUS
  Filled 2019-01-04: qty 1

## 2019-01-04 MED ORDER — ONDANSETRON HCL 4 MG/2ML IJ SOLN
4.0000 mg | Freq: Once | INTRAMUSCULAR | Status: AC
  Administered 2019-01-04: 4 mg via INTRAVENOUS
  Filled 2019-01-04: qty 2

## 2019-01-04 MED ORDER — OXYCODONE-ACETAMINOPHEN 5-325 MG PO TABS: 1.0000 | ORAL_TABLET | Freq: Four times a day (QID) | ORAL | 0 refills | Status: DC | PRN

## 2019-01-04 MED ORDER — HYDROMORPHONE HCL 1 MG/ML IJ SOLN
0.5000 mg | Freq: Once | INTRAMUSCULAR | Status: AC
  Administered 2019-01-04: 0.5 mg via INTRAVENOUS
  Filled 2019-01-04: qty 1

## 2019-01-04 MED ORDER — HYDROMORPHONE HCL 1 MG/ML IJ SOLN
1.0000 mg | Freq: Once | INTRAMUSCULAR | Status: AC
  Administered 2019-01-04: 1 mg via INTRAVENOUS
  Filled 2019-01-04: qty 1

## 2019-01-04 NOTE — ED Provider Notes (Signed)
[facility] EMERGENCY DEPARTMENT Provider Note   CSN: [id number] Arrival date & time: 01/04/19  7672     History Chief Complaint  Patient presents with  . Abdominal Pain    Anthony Preston is a 38 y.o. male.  Patient c/o acute onset left flank pain this AM at rest. Denies back or flank injury or strain. Symptoms acute onset, severe, dull,  constant, waxing and waning in intensity, left flank posteriorly radiating to side. No hx same symptoms. No dysuria or hematuria. Denies hx kidney stones. +nausea. No vomiting or diarrhea. No anterior pain. No fever or chills.   The history is provided by the patient.  Abdominal Pain Associated symptoms: no chest pain, no chills, no diarrhea, no dysuria, no fever, no hematuria, no shortness of breath, no sore throat and no vomiting        Past Medical History:  Diagnosis Date  . Hypertension     There are no problems to display for this patient.   No past surgical history on file.     No family history on file.  Social History   Tobacco Use  . Smoking status: Current Every Day Smoker    Packs/day: 0.50    Types: Cigarettes  . Smokeless tobacco: Never Used  Substance Use Topics  . Alcohol use: Yes  . 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 Authorizing Provider  benzonatate (TESSALON) 100 MG capsule Take 1 capsule (100 mg total) by mouth 3 (three) times daily as needed for cough. 12/03/18   Fawze, Mina A, PA-C  doxycycline (VIBRAMYCIN) 100 MG capsule Take 1 capsule (100 mg total) by mouth 2 (two) times daily. 07/17/18   Aviva Kluver B, PA-C  fluticasone (FLONASE) 50 MCG/ACT nasal spray Place 2 sprays into both nostrils daily. 12/03/18   Michela Pitcher A, PA-C    Allergies    Patient has no known allergies.  Review of Systems   Review of Systems    Constitutional: Negative for chills and fever.  HENT: Negative for sore throat.   Eyes: Negative for redness.  Respiratory: Negative for shortness of breath.   Cardiovascular: Negative for chest pain.  Gastrointestinal: Positive for abdominal pain. Negative for diarrhea and vomiting.  Genitourinary: Negative for dysuria, flank pain, hematuria and testicular pain.  Musculoskeletal: Negative for neck pain.  Skin: Negative for rash.  Neurological: Negative for weakness, numbness and headaches.  Hematological: Does not bruise/bleed easily.  Psychiatric/Behavioral: Negative for confusion.    Physical Exam Updated Vital Signs BP (!) 153/104 (BP Location: Right Arm)   Pulse 79   Temp 98.2 F (36.8 C) (Oral)   Resp (!) 21   Ht 1.651 m (5\' 5" )   Wt 63.5 kg   SpO2 98%   BMI 23.30 kg/m   Physical Exam Vitals and nursing note reviewed.  Constitutional:      Appearance: Normal appearance. He is well-developed.  HENT:     Head: Atraumatic.     Nose: Nose normal.     Mouth/Throat:     Mouth: Mucous membranes are moist.     Pharynx: Oropharynx is clear.  Eyes:     General: No scleral icterus.    Conjunctiva/sclera: Conjunctivae normal.  Neck:     Trachea: No tracheal deviation.  Cardiovascular:     Rate and Rhythm: Normal rate  and regular rhythm.     Pulses: Normal pulses.     Heart sounds: Normal heart sounds. No murmur. No friction rub. No gallop.   Pulmonary:     Effort: Pulmonary effort is normal. No accessory muscle usage or respiratory distress.     Breath sounds: Normal breath sounds.  Abdominal:     General: Bowel sounds are normal. There is no distension.     Palpations: Abdomen is soft. There is no mass.     Tenderness: There is no abdominal tenderness. There is no guarding or rebound.     Hernia: No hernia is present.  Genitourinary:    Comments: No cva tenderness. Musculoskeletal:        General: No swelling.     Cervical back: Normal range of motion and neck  supple. No rigidity.     Comments: T/L/S spine non tender, aligned, no step off.   Skin:    General: Skin is warm and dry.     Findings: No rash.     Comments: No skin rash or lesions in area of pain.   Neurological:     Mental Status: He is alert.     Comments: Alert, speech clear. Motor/sens grossly intact bil. Steady gait.   Psychiatric:        Mood and Affect: Mood normal.     ED Results / Procedures / Treatments   Labs (all labs ordered are listed, but only abnormal results are displayed) Results for orders placed or performed during the hospital encounter of 01/04/19  Lipase, blood  Result Value Ref Range   Lipase 22 11 - 51 U/L  Comprehensive metabolic panel  Result Value Ref Range   Sodium 139 135 - 145 mmol/L   Potassium 3.3 (L) 3.5 - 5.1 mmol/L   Chloride 101 98 - 111 mmol/L   CO2 26 22 - 32 mmol/L   Glucose, Bld 88 70 - 99 mg/dL   BUN 17 6 - 20 mg/dL   Creatinine, Ser 1.29 (H) 0.61 - 1.24 mg/dL   Calcium 9.3 8.9 - 10.3 mg/dL   Total Protein 7.8 6.5 - 8.1 g/dL   Albumin 4.3 3.5 - 5.0 g/dL   AST 29 15 - 41 U/L   ALT 24 0 - 44 U/L   Alkaline Phosphatase 68 38 - 126 U/L   Total Bilirubin 1.8 (H) 0.3 - 1.2 mg/dL   GFR calc non Af Amer >60 >60 mL/min   GFR calc Af Amer >60 >60 mL/min   Anion gap 12 5 - 15  CBC  Result Value Ref Range   WBC 7.7 4.0 - 10.5 K/uL   RBC 5.68 4.22 - 5.81 MIL/uL   Hemoglobin 14.0 13.0 - 17.0 g/dL   HCT 41.1 39.0 - 52.0 %   MCV 72.4 (L) 80.0 - 100.0 fL   MCH 24.6 (L) 26.0 - 34.0 pg   MCHC 34.1 30.0 - 36.0 g/dL   RDW 14.8 11.5 - 15.5 %   Platelets 329 150 - 400 K/uL   nRBC 0.0 0.0 - 0.2 %     Radiology CT Renal Stone Study  Result Date: 01/04/2019 CLINICAL DATA:  Flank pain, kidney stone EXAM: CT ABDOMEN AND PELVIS WITHOUT CONTRAST TECHNIQUE: Multidetector CT imaging of the abdomen and pelvis was performed following the standard protocol without IV contrast. COMPARISON:  None. FINDINGS: Lower chest: Lung bases are clear.  Hepatobiliary: No focal hepatic lesion. No biliary duct dilatation. Gallbladder is normal. Common bile duct is normal. Pancreas:  Pancreas is normal. No ductal dilatation. No pancreatic inflammation. Spleen: Normal spleen Adrenals/urinary tract: Adrenal glands normal. There is mild renal edema of the LEFT kidney and mild perinephric stranding. Mild hydroureter on the LEFT. There is an obstructing calculus in the distal LEFT ureter measuring 3 mm (image 69/2). This distal LEFT ureteral calculus approximately 2 cm from the vascular junction. No additional nephrolithiasis.  No bladder calculi. Stomach/Bowel: Stomach, small bowel, appendix, and cecum are normal. The colon and rectosigmoid colon are normal. Vascular/Lymphatic: Abdominal aorta is normal caliber. No periportal or retroperitoneal adenopathy. No pelvic adenopathy. Reproductive: Prostate normal Other: No free fluid. Musculoskeletal: Densely sclerotic lesion in the RIGHT iliac bone measuring 8 mm (image 58/2). Additional small ovoid and punctate sclerotic lesions in the medial RIGHT iliac bone adjacent to the SI joint (image 56/2). Punctate sclerotic lesion in the S1 vertebral body. There is sclerosis along the SI joints. IMPRESSION: 1. Obstructing calculus in the distal LEFT ureter several cm from the LEFT vesicoureteral junction. 2. No nephrolithiasis identified. 3. Several sclerotic lesions in the RIGHT iliac bone and sacrum. Favor benign sclerotic lesions in the absence of known carcinoma. Consider PSA evaluation as a precaution. Electronically Signed   By: Genevive BiStewart  Edmunds M.D.   On: 01/04/2019 08:31    Procedures Procedures (including critical care time)  Medications Ordered in ED Medications  HYDROmorphone (DILAUDID) injection 1 mg (has no administration in time range)  ondansetron (ZOFRAN) injection 4 mg (has no administration in time range)    ED Course  I have reviewed the triage vital signs and the nursing notes.  Pertinent labs &  imaging results that were available during my care of the patient were reviewed by me and considered in my medical decision making (see chart for details).    MDM Rules/Calculators/A&P  Labs sent.   Imaging ordered.  Dilaudid iv, zofran iv.   Reviewed nursing notes and prior charts for additional history.   Labs reviewed/interpreted by me - wbc normal. ua pending.   CT reviewed/interpreted by me - left ureteral stone  - discussed w pt. Also discussed and rec f/u were the bony lesions noted by radiology and need for pcp f/u discussed w pt.   Pain improved but persists. toradol iv.   Recheck pt, pain much improved, pt comfortable, no distress.   Recheck abd soft nt.   Patient currently appears stable for d/c.  Return precautions provided.    Final Clinical Impression(s) / ED Diagnoses Final diagnoses:  None    Rx / DC Orders ED Discharge Orders    None       Cathren LaineSteinl, Lenny Fiumara, MD 01/04/19 [phone]

## 2019-01-04 NOTE — ED Notes (Signed)
VM left for pt's step-father, Governor Rooks, at [phone] re: need for ride

## 2019-01-04 NOTE — ED Notes (Signed)
Patient returned from CT

## 2019-01-04 NOTE — ED Notes (Signed)
VM left for pt's mother, Max Sane at [phone], letting her know that pt needs a ride home.

## 2019-01-04 NOTE — ED Triage Notes (Addendum)
Pt comes in by EMS with Left sided abd pain described as burning pain 10/10.  Denies N/V/D. Pt states smoked meth at approx 0530 today.

## 2019-01-04 NOTE — Discharge Instructions (Addendum)
It was our pleasure to provide your ER care today - we hope that you feel better.  Your CT was read as showing: IMPRESSION:  1. Obstructing calculus in the distal LEFT ureter several cm from the LEFT vesicoureteral junction.  2. Several sclerotic lesions in the RIGHT iliac bone and sacrum. Favor benign sclerotic lesions in the absence of known carcinoma. Consider PSA evaluation as a precaution.   For kidney stone(s) - stay well hydrated, drink plenty of fluids. Take motrin or aleve as need for pain. You may also take percocet as need for pain. No driving for the next 6 hours or when taking percocet. Also, do not take tylenol or acetaminophen containing medication when taking percocet. Follow p with urologist in the coming week if symptoms fail to improve/resolve.  For sclerotic/bony lesions - follow up with primary care doctor in the next few weeks - discuss CT findings and discuss further evaluation with them.   Return to ER if worse, new symptoms, fevers, severe or intractable pain, persistent vomiting, or other concern.

## 2019-01-04 NOTE — ED Notes (Signed)
Spoke to pt's mother and she will be on her way to get pt

## 2019-01-04 NOTE — ED Notes (Signed)
Patient transported to CT 

## 2019-01-05 ENCOUNTER — Emergency Department (HOSPITAL_COMMUNITY)
Admission: EM | Admit: 2019-01-05 | Discharge: 2019-01-06 | Disposition: A | Discharge status: Home / Self Care | Payer: Self-pay | Attending: Emergency Medicine | Admitting: Emergency Medicine

## 2019-01-05 ENCOUNTER — Encounter (HOSPITAL_COMMUNITY): Payer: Self-pay | Admitting: Emergency Medicine

## 2019-01-05 DIAGNOSIS — Z5321 Procedure and treatment not carried out due to patient leaving prior to being seen by health care provider: Secondary | ICD-10-CM | POA: Insufficient documentation

## 2019-01-05 DIAGNOSIS — R1031 Right lower quadrant pain: Secondary | ICD-10-CM | POA: Insufficient documentation

## 2019-01-05 HISTORY — DX: Calculus of kidney: N20.0

## 2019-01-05 LAB — URINALYSIS, ROUTINE W REFLEX MICROSCOPIC
Bilirubin Urine: NEGATIVE
Glucose, UA: NEGATIVE mg/dL
Ketones, ur: NEGATIVE mg/dL
Nitrite: NEGATIVE
Protein, ur: NEGATIVE mg/dL
RBC / HPF: >50 RBC/hpf — ABNORMAL HIGH (ref 0–5)
Specific Gravity, Urine: 1.014 (ref 1.005–1.030)
pH: 6 (ref 5.0–8.0)

## 2019-01-05 MED ORDER — OXYCODONE-ACETAMINOPHEN 5-325 MG PO TABS
1.0000 | ORAL_TABLET | ORAL | Status: AC
  Administered 2019-01-05: 21:00:00 1 via ORAL
  Filled 2019-01-05: qty 1

## 2019-01-05 NOTE — ED Triage Notes (Signed)
Pt presents with right side flank pain. He was seen and discharged with pain medications, he states "I am unable to afford them." tolerating oral fluids w/o n/v. Pt is riving in pain at triage.  Spoke with Janeece Fitting, Pa for orders.

## 2019-03-13 ENCOUNTER — Emergency Department (HOSPITAL_COMMUNITY)
Admission: EM | Admit: 2019-03-13 | Discharge: 2019-03-13 | Disposition: A | Discharge status: Home / Self Care | Payer: Self-pay | Attending: Emergency Medicine | Admitting: Emergency Medicine

## 2019-03-13 ENCOUNTER — Other Ambulatory Visit: Payer: Self-pay

## 2019-03-13 ENCOUNTER — Encounter (HOSPITAL_COMMUNITY): Payer: Self-pay | Admitting: Emergency Medicine

## 2019-03-13 DIAGNOSIS — F17210 Nicotine dependence, cigarettes, uncomplicated: Secondary | ICD-10-CM | POA: Insufficient documentation

## 2019-03-13 DIAGNOSIS — M545 Low back pain, unspecified: Secondary | ICD-10-CM

## 2019-03-13 DIAGNOSIS — Z87442 Personal history of urinary calculi: Secondary | ICD-10-CM | POA: Insufficient documentation

## 2019-03-13 DIAGNOSIS — I10 Essential (primary) hypertension: Secondary | ICD-10-CM | POA: Insufficient documentation

## 2019-03-13 MED ORDER — LIDOCAINE 5 % EX PTCH
2.0000 | MEDICATED_PATCH | CUTANEOUS | Status: DC
  Administered 2019-03-13: 08:00:00 2 via TRANSDERMAL
  Filled 2019-03-13 (×2): qty 2

## 2019-03-13 MED ORDER — LIDOCAINE 5 % EX PTCH: 2.0000 | MEDICATED_PATCH | CUTANEOUS | 0 refills | Status: AC

## 2019-03-13 MED ORDER — DIAZEPAM 5 MG PO TABS
5.0000 mg | ORAL_TABLET | Freq: Once | ORAL | Status: AC
  Administered 2019-03-13: 08:00:00 5 mg via ORAL
  Filled 2019-03-13: qty 1

## 2019-03-13 MED ORDER — OXYCODONE-ACETAMINOPHEN 5-325 MG PO TABS
2.0000 | ORAL_TABLET | Freq: Once | ORAL | Status: AC
  Administered 2019-03-13: 08:00:00 2 via ORAL
  Filled 2019-03-13: qty 2

## 2019-03-13 MED ORDER — METHOCARBAMOL 500 MG PO TABS: 500.0000 mg | ORAL_TABLET | Freq: Three times a day (TID) | ORAL | 0 refills | Status: AC | PRN

## 2019-03-13 NOTE — ED Triage Notes (Signed)
Patient arrived by EMS from home. Patient c/o bilateral flank pain x 2 days.   Patient stated pain is now constant.   Patient has a similar event with unilateral pain 2 months ago.

## 2019-03-13 NOTE — Discharge Instructions (Addendum)
You were seen in the emergency department for back pain today.  At this time we suspect that your pain is related to a muscle strain/spasm.   We are sending you home with the following medicines: - Robaxin is the muscle relaxer I have prescribed, this is meant to help with muscle tightness. Be aware that this medication may make you drowsy therefore the first time you take this it should be at a time you are in an environment where you can rest. Do not drive or operate heavy machinery when taking this medication. Do not drink alcohol or take other sedating medications with this medicine such as narcotics or benzodiazepines.  - Lidoderm patch-please apply 1 patch to each side of your back once per day to help soothe the muscle.  You make take Tylenol per over the counter dosing with these medications.   We have prescribed you new medication(s) today. Discuss the medications prescribed today with your pharmacist as they can have adverse effects and interactions with your other medicines including over the counter and prescribed medications. Seek medical evaluation if you start to experience new or abnormal symptoms after taking one of these medicines, seek care immediately if you start to experience difficulty breathing, feeling of your throat closing, facial swelling, or rash as these could be indications of a more serious allergic reaction  The application of heat can help soothe the pain.    Your pain should get better over the next 2 weeks.  You will need to follow up with  Your primary healthcare provider in 1-2 weeks for reassessment, if you do not have a primary care provider one is provided in your discharge instructions- you may see the [place] [facility] or call the provided phone number. However return to the ER should you develop ne or worsening symptoms or any other concerns including but not limited to severe or worsening pain, low back pain with fever, numbness, weakness, blood in your  urine, trouble urinating, loss of bowel or bladder control, or inability to walk or urinate, you should return to the ER immediately.

## 2019-03-13 NOTE — ED Provider Notes (Signed)
[facility]-EMERGENCY DEPT Provider Note   CSN: [id number] Arrival date & time: 03/13/19  0938     History Chief Complaint  Patient presents with  . Back Pain    Anthony Preston is a 39 y.o. male with a history of prior kidney stone who presents to the ED with complaints of back pain x 3-4 days. Patient states pain is to the mid to lower bilateral back, initially intermittent now constant, worse with certain movements/positions, no alleviating factors. Tried tylenol without relief. Denies numbness, tingling, weakness, saddle anesthesia, incontinence to bowel/bladder, fever, chills, IV drug use, dysuria, hematuria, or hx of cancer. Patient has not had prior back surgeries. Denies recent trauma or change in activity.    HPI     Past Medical History:  Diagnosis Date  . Hypertension   . Renal calculi     There are no problems to display for this patient.   History reviewed. No pertinent surgical history.     History reviewed. No pertinent family history.  Social History   Tobacco Use  . Smoking status: Current Every Day Smoker    Packs/day: 0.50    Types: Cigarettes  . Smokeless tobacco: Never Used  Substance Use Topics  . Alcohol use: Yes  . 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 Authorizing Provider  benzonatate (TESSALON) 100 MG capsule Take 1 capsule (100 mg total) by mouth 3 (three) times daily as needed for cough. 12/03/18   Fawze, Mina A, PA-C  doxycycline (VIBRAMYCIN) 100 MG capsule Take 1 capsule (100 mg total) by mouth 2 (two) times daily. 07/17/18   Aviva Kluver B, PA-C  fluticasone (FLONASE) 50 MCG/ACT nasal spray Place 2 sprays into both nostrils daily. 12/03/18   Fawze, Mina A, PA-C  oxyCODONE-acetaminophen (PERCOCET/ROXICET) 5-325 MG tablet Take 1-2 tablets by mouth every 6 (six)  hours as needed for severe pain. 01/04/19   Cathren Laine, MD    Allergies    Patient has no known allergies.  Review of Systems   Review of Systems  Constitutional: Negative for chills, fever and unexpected weight change.  Gastrointestinal: Negative for abdominal pain, nausea and vomiting.  Genitourinary: Negative for discharge, dysuria, frequency, hematuria, scrotal swelling, testicular pain and urgency.  Musculoskeletal: Positive for back pain.  Neurological: Negative for weakness and numbness.       Negative for saddle anesthesia or bowel/bladder incontinence.     Physical Exam Updated Vital Signs BP (!) 163/103 (BP Location: Left Arm)   Pulse (!) 101   Temp 99.1 F (37.3 C) (Oral)   Resp 20   Ht 5\' 5"  (1.651 m)   Wt 65.8 kg   SpO2 100%   BMI 24.13 kg/m   Physical Exam Vitals and nursing note reviewed.  Constitutional:      General: He is not in acute distress.    Appearance: He is well-developed. He is not toxic-appearing.     Comments: Patient appears somewhat uncomfortable with moving on stretcher for exam.   HENT:     Head: Normocephalic and atraumatic.  Eyes:     General:        Right eye: No discharge.        Left eye: No discharge.     Conjunctiva/sclera: Conjunctivae normal.  Cardiovascular:     Rate and Rhythm: Normal rate  and regular rhythm.  Pulmonary:     Effort: Pulmonary effort is normal. No respiratory distress.     Breath sounds: Normal breath sounds. No wheezing, rhonchi or rales.  Abdominal:     General: There is no distension.     Palpations: Abdomen is soft.     Tenderness: There is no abdominal tenderness. There is no guarding or rebound.  Musculoskeletal:     Cervical back: Normal range of motion and neck supple. No spinous process tenderness or muscular tenderness.     Comments: No obvious deformity, appreciable swelling, erythema, ecchymosis, significant open wounds, or increased warmth.  Extremities: Normal ROM. Nontender.  Back: No  point/focal vertebral tenderness, no palpable step off or crepitus. Tenderness to palpation to the bilateral lower thoracic & diffuse lumbar paraspinal muscles.   Skin:    General: Skin is warm and dry.     Findings: No rash.  Neurological:     Mental Status: He is alert.     Deep Tendon Reflexes:     Reflex Scores:      Patellar reflexes are 2+ on the right side and 2+ on the left side.    Comments: Sensation grossly intact to bilateral lower extremities. 5/5 symmetric strength with plantar/dorsiflexion bilaterally. Gait is intact without obvious foot drop.   Psychiatric:        Behavior: Behavior normal.     ED Results / Procedures / Treatments   Labs (all labs ordered are listed, but only abnormal results are displayed) Labs Reviewed - No data to display  EKG None  Radiology No results found.  Procedures Procedures (including critical care time)  Medications Ordered in ED Medications  diazepam (VALIUM) tablet 5 mg (has no administration in time range)  lidocaine (LIDODERM) 5 % 2 patch (has no administration in time range)  oxyCODONE-acetaminophen (PERCOCET/ROXICET) 5-325 MG per tablet 2 tablet (has no administration in time range)    ED Course  I have reviewed the triage vital signs and the nursing notes.  Pertinent labs & imaging results that were available during my care of the patient were reviewed by me and considered in my medical decision making (see chart for details).    MDM Rules/Calculators/A&P                      Patient presents with complaint of back pain.  Patient is nontoxic appearing, vitals are WNL on my exam with exception of elevated BP which normalized after analgesics in the ED- doubt HTN emergency. Patient has normal neurologic exam, no point/focal midline tenderness to palpation. Ambulatory in the ED.  No back pain red flags. No urinary sxs. Bilateral. Most likely muscle strain versus spasm. Considered UTI/pyelonephritis, kidney stone, aortic  aneurysm/dissection, cauda equina or epidural abscess however feel these diagnoses do not fit clinical picture at this time. Pain improved s/p interventions in the ED. NSAIDs avoided secondary to abnormal creatinine on chart review. Will discharge home with muscle relaxant & lidoderm patches. Discussed no driving or operating heavy machinery when taking muscle relaxant. I discussed treatment plan, need for PCP follow-up, and return precautions with the patient. Provided opportunity for questions, patient confirmed understanding and is in agreement with plan.   Final Clinical Impression(s) / ED Diagnoses Final diagnoses:  Acute bilateral low back pain without sciatica    Rx / DC Orders ED Discharge Orders         Ordered    methocarbamol (ROBAXIN) 500 MG tablet  Every 8  hours PRN     03/13/19 0938    lidocaine (LIDODERM) 5 %  Every 24 hours     03/13/19 0938           Amaryllis Dyke, PA-C 03/13/19 0941    Lacretia Leigh, MD 03/16/19 1209

## 2019-04-21 ENCOUNTER — Other Ambulatory Visit: Payer: Self-pay

## 2019-04-21 DIAGNOSIS — R21 Rash and other nonspecific skin eruption: Secondary | ICD-10-CM | POA: Insufficient documentation

## 2019-04-21 DIAGNOSIS — R0981 Nasal congestion: Secondary | ICD-10-CM | POA: Insufficient documentation

## 2019-04-21 DIAGNOSIS — F17210 Nicotine dependence, cigarettes, uncomplicated: Secondary | ICD-10-CM | POA: Insufficient documentation

## 2019-04-21 DIAGNOSIS — Z7982 Long term (current) use of aspirin: Secondary | ICD-10-CM | POA: Insufficient documentation

## 2019-04-21 DIAGNOSIS — Z79899 Other long term (current) drug therapy: Secondary | ICD-10-CM | POA: Insufficient documentation

## 2019-04-21 DIAGNOSIS — I10 Essential (primary) hypertension: Secondary | ICD-10-CM | POA: Insufficient documentation

## 2019-04-22 ENCOUNTER — Emergency Department (HOSPITAL_COMMUNITY)
Admission: EM | Admit: 2019-04-22 | Discharge: 2019-04-22 | Disposition: A | Discharge status: Home / Self Care | Payer: Self-pay | Attending: Emergency Medicine | Admitting: Emergency Medicine

## 2019-04-22 ENCOUNTER — Encounter (HOSPITAL_COMMUNITY): Payer: Self-pay

## 2019-04-22 DIAGNOSIS — R21 Rash and other nonspecific skin eruption: Secondary | ICD-10-CM

## 2019-04-22 DIAGNOSIS — R0981 Nasal congestion: Secondary | ICD-10-CM

## 2019-04-22 LAB — RPR
RPR Ser Ql: REACTIVE — AB
RPR Titer: 1:32 {titer}

## 2019-04-22 NOTE — ED Provider Notes (Signed)
[facility]-EMERGENCY DEPT Provider Note   CSN: [id number] Arrival date & time: 04/21/19  2303     History No chief complaint on file.   Anthony Preston is a 39 y.o. male.  Patient to ED with multiple complaints. He reports SOB described as "I can't breathe through my nose". No fever, cough, wheezing. He has not used anything to relieve symptoms. He has had COVID in the past 2 months. No history of asthma/allergies. No regular use of inhaler. He is a smoker. He complains of a rash he states is the result of bed bugs from staying at a motel several weeks ago. He also complains of a rash to the penis and surrounding area. The rash is associated with itching. No blistering or drainage. He has not tried anything for itching.   The history is provided by the patient. No language interpreter was used.       Past Medical History:  Diagnosis Date  . Hypertension   . Renal calculi     There are no problems to display for this patient.   No past surgical history on file.     No family history on file.  Social History   Tobacco Use  . Smoking status: Current Every Day Smoker    Packs/day: 0.50    Types: Cigarettes  . Smokeless tobacco: Never Used  Substance Use Topics  . Alcohol use: Yes  . 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 Authorizing Provider  acetaminophen (TYLENOL) 500 MG tablet Take 1,000 mg by mouth every 6 (six) hours as needed for mild pain.   Yes [provider]  aspirin 81 MG chewable tablet Chew 81 mg by mouth daily.   Yes [provider]  hydrochlorothiazide (HYDRODIURIL) 25 MG tablet Take 25 mg by mouth daily.   Yes [provider]  lidocaine (LIDODERM) 5 % Place 2 patches onto the skin daily. Apply 2 patches (1 to each side) to your mid to lower back once  per day, Remove & Discard patch within 12 hours. Patient not taking: Reported on 04/22/2019 03/13/19   Petrucelli, Pleas Koch, PA-C  methocarbamol (ROBAXIN) 500 MG tablet Take 1 tablet (500 mg total) by mouth every 8 (eight) hours as needed. Patient not taking: Reported on 04/22/2019 03/13/19   Petrucelli, Lelon Mast R, PA-C  fluticasone (FLONASE) 50 MCG/ACT nasal spray Place 2 sprays into both nostrils daily. Patient not taking: Reported on 03/13/2019 12/03/18 03/13/19  Michela Pitcher A, PA-C    Allergies    Patient has no known allergies.  Review of Systems   Review of Systems  Constitutional: Negative for chills and fever.  HENT: Positive for congestion.   Respiratory: Negative.  Negative for cough and wheezing.   Cardiovascular: Negative.  Negative for chest pain.  Gastrointestinal: Negative.   Genitourinary: Positive for genital sores (Rash x 1 month). Negative for discharge and testicular pain.  Musculoskeletal: Negative.   Skin: Positive for rash.  Neurological: Negative.     Physical Exam Updated Vital Signs BP (!) 163/116 (BP Location: Right Arm)   Pulse 94   Temp 98.4 F (36.9 C)   Resp 16   Ht 5\' 5"  (1.651 m)   Wt 65.8 kg   SpO2 100%   BMI 24.13 kg/m   Physical Exam Vitals and nursing note reviewed.  Constitutional:      Appearance: He is well-developed.  HENT:     Head: Normocephalic.     Nose: Congestion present.  Cardiovascular:     Rate and Rhythm: Normal rate and regular rhythm.  Pulmonary:     Effort: Pulmonary effort is normal.     Breath sounds: No wheezing, rhonchi or rales.  Abdominal:     General: Bowel sounds are normal.     Palpations: Abdomen is soft.     Tenderness: There is no abdominal tenderness. There is no guarding or rebound.  Musculoskeletal:        General: Normal range of motion.     Cervical back: Normal range of motion and neck supple.  Skin:    General: Skin is warm and dry.     Findings: No rash.     Comments: Generalized  papulosquamous rash, including penis and scrotum. Minimal scaling. No blisters, erythema, drainage. No herald patch identified.   Neurological:     Mental Status: He is alert and oriented to person, place, and time.     ED Results / Procedures / Treatments   Labs (all labs ordered are listed, but only abnormal results are displayed) Labs Reviewed  RPR    EKG None  Radiology No results found.  Procedures Procedures (including critical care time)  Medications Ordered in ED Medications - No data to display  ED Course  I have reviewed the triage vital signs and the nursing notes.  Pertinent labs & imaging results that were available during my care of the patient were reviewed by me and considered in my medical decision making (see chart for details).    MDM Rules/Calculators/A&P                      Patient to ED with c/o SOB secondary to nasal congestion. No fever, no hypoxia. "I feel better now". Will suggest nasal spray, antihistamines.   C/O rash that is generalized he thought was the result of bed bugs. Rash is not c/w bed bug bites in appearance or duration of symptoms. DDx considered include secondary syphilis, pityriasis rosea. RPR pending. With some scaling, also consider fungal type rash. Will treat symptomatically. Recommend PCP follow up. Will provide referrals.   Patient reports history of HTN. States compliance with medications. BP elevated on visit today. No CP. SOB felt to be from nasal congestion, not pulmonary compromise. Recommend follow up with PCP.   Final Clinical Impression(s) / ED Diagnoses Final diagnoses:  None   1. URI 2. Rash   Rx / DC Orders ED Discharge Orders    None       Charlann Lange, PA-C 04/22/19 0152    Rolland Porter, MD 04/22/19 [phone]

## 2019-04-22 NOTE — ED Triage Notes (Signed)
Pt complains of being congested and having bug bitees

## 2019-04-22 NOTE — Discharge Instructions (Addendum)
Recommend a nasal steroid (ie Nasacort, or Flonase) for nasal congestion. These medications can be found over-the-counter. Avoid decongestants as these can raise your blood pressure.   Recommend Benadryl for itching from the rash. Suspect this is a viral rash called pityriasis which will resolve over time. Benadryl will help with itching. It is an antihistamine which will also help with nasal congestion. There is a possibility of the rash being related to a syphilis infection. Your blood test is pending and the results can be viewed in MyChart in 24-48 hours. If this is positive, it is imperative that your return here for antibiotics.   You have been provided resources for primary care providers. Please call to make an appointment to become established with a provider for routine medical concerns.

## 2019-04-23 LAB — T.PALLIDUM AB, TOTAL: T Pallidum Abs: REACTIVE — AB

## 2019-04-26 ENCOUNTER — Emergency Department (HOSPITAL_BASED_OUTPATIENT_CLINIC_OR_DEPARTMENT_OTHER): Payer: Self-pay

## 2019-04-26 ENCOUNTER — Emergency Department (HOSPITAL_COMMUNITY): Payer: Self-pay

## 2019-04-26 ENCOUNTER — Emergency Department (HOSPITAL_COMMUNITY)
Admission: EM | Admit: 2019-04-26 | Discharge: 2019-04-26 | Disposition: A | Discharge status: Home / Self Care | Payer: Self-pay | Attending: Emergency Medicine | Admitting: Emergency Medicine

## 2019-04-26 ENCOUNTER — Other Ambulatory Visit: Payer: Self-pay

## 2019-04-26 ENCOUNTER — Encounter (HOSPITAL_COMMUNITY): Payer: Self-pay | Admitting: Emergency Medicine

## 2019-04-26 DIAGNOSIS — I10 Essential (primary) hypertension: Secondary | ICD-10-CM | POA: Insufficient documentation

## 2019-04-26 DIAGNOSIS — Z79899 Other long term (current) drug therapy: Secondary | ICD-10-CM | POA: Insufficient documentation

## 2019-04-26 DIAGNOSIS — L03115 Cellulitis of right lower limb: Secondary | ICD-10-CM | POA: Insufficient documentation

## 2019-04-26 DIAGNOSIS — Z7982 Long term (current) use of aspirin: Secondary | ICD-10-CM | POA: Insufficient documentation

## 2019-04-26 DIAGNOSIS — R609 Edema, unspecified: Secondary | ICD-10-CM

## 2019-04-26 DIAGNOSIS — A5149 Other secondary syphilitic conditions: Secondary | ICD-10-CM | POA: Insufficient documentation

## 2019-04-26 DIAGNOSIS — F17210 Nicotine dependence, cigarettes, uncomplicated: Secondary | ICD-10-CM | POA: Insufficient documentation

## 2019-04-26 DIAGNOSIS — K922 Gastrointestinal hemorrhage, unspecified: Secondary | ICD-10-CM | POA: Insufficient documentation

## 2019-04-26 LAB — COMPREHENSIVE METABOLIC PANEL
ALT: 26 U/L (ref 0–44)
AST: 23 U/L (ref 15–41)
Albumin: 2.7 g/dL — ABNORMAL LOW (ref 3.5–5.0)
Alkaline Phosphatase: 108 U/L (ref 38–126)
Anion gap: 7 (ref 5–15)
BUN: 13 mg/dL (ref 6–20)
CO2: 26 mmol/L (ref 22–32)
Calcium: 8.2 mg/dL — ABNORMAL LOW (ref 8.9–10.3)
Chloride: 104 mmol/L (ref 98–111)
Creatinine, Ser: 0.87 mg/dL (ref 0.61–1.24)
GFR calc Af Amer: >60 mL/min (ref >60)
GFR calc non Af Amer: >60 mL/min (ref >60)
Glucose, Bld: 107 mg/dL — ABNORMAL HIGH (ref 70–99)
Potassium: 3.1 mmol/L — ABNORMAL LOW (ref 3.5–5.1)
Sodium: 137 mmol/L (ref 135–145)
Total Bilirubin: 0.9 mg/dL (ref 0.3–1.2)
Total Protein: 7.6 g/dL (ref 6.5–8.1)

## 2019-04-26 LAB — URINALYSIS, ROUTINE W REFLEX MICROSCOPIC
Bacteria, UA: NONE SEEN
Bilirubin Urine: NEGATIVE
Glucose, UA: NEGATIVE mg/dL
Hgb urine dipstick: NEGATIVE
Ketones, ur: NEGATIVE mg/dL
Leukocytes,Ua: NEGATIVE
Nitrite: NEGATIVE
Protein, ur: 30 mg/dL — AB
Specific Gravity, Urine: 1.026 (ref 1.005–1.030)
pH: 6 (ref 5.0–8.0)

## 2019-04-26 LAB — CBC
HCT: 31.2 % — ABNORMAL LOW (ref 39.0–52.0)
Hemoglobin: 10 g/dL — ABNORMAL LOW (ref 13.0–17.0)
MCH: 22 pg — ABNORMAL LOW (ref 26.0–34.0)
MCHC: 32.1 g/dL (ref 30.0–36.0)
MCV: 68.7 fL — ABNORMAL LOW (ref 80.0–100.0)
Platelets: 539 10*3/uL — ABNORMAL HIGH (ref 150–400)
RBC: 4.54 MIL/uL (ref 4.22–5.81)
RDW: 14.9 % (ref 11.5–15.5)
WBC: 7.8 10*3/uL (ref 4.0–10.5)
nRBC: 0 % (ref 0.0–0.2)

## 2019-04-26 LAB — HIV ANTIBODY (ROUTINE TESTING W REFLEX): HIV Screen 4th Generation wRfx: NONREACTIVE

## 2019-04-26 LAB — TYPE AND SCREEN
ABO/RH(D): O POS
Antibody Screen: NEGATIVE

## 2019-04-26 LAB — ABO/RH: ABO/RH(D): O POS

## 2019-04-26 MED ORDER — DOXYCYCLINE HYCLATE 100 MG PO CAPS: 100.0000 mg | ORAL_CAPSULE | Freq: Two times a day (BID) | ORAL | 0 refills | Status: DC

## 2019-04-26 MED ORDER — OMEPRAZOLE 20 MG PO CPDR: 20.0000 mg | DELAYED_RELEASE_CAPSULE | Freq: Every day | ORAL | 0 refills | Status: AC

## 2019-04-26 MED ORDER — PENICILLIN G BENZATHINE 1200000 UNIT/2ML IM SUSP
2.4000 10*6.[IU] | Freq: Once | INTRAMUSCULAR | Status: AC
  Administered 2019-04-26: 2.4 10*6.[IU] via INTRAMUSCULAR
  Filled 2019-04-26: qty 4

## 2019-04-26 MED ORDER — LACTATED RINGERS IV BOLUS
1000.0000 mL | Freq: Once | INTRAVENOUS | Status: AC
  Administered 2019-04-26: 1000 mL via INTRAVENOUS

## 2019-04-26 NOTE — ED Triage Notes (Signed)
Patient here from home with complaints of bilateral flank pain that started "days" ago. States that he has been taking "multiple" tablets of ibuprofen "maybe around 9-10" daily. Now has blood in stool and right sided leg and ankle swelling. Unable to put on shoe.

## 2019-04-26 NOTE — ED Provider Notes (Addendum)
[facility]  HOSPITAL-EMERGENCY DEPT Provider Note   CSN: [id number] Arrival date & time: 04/26/19  1313     History Chief Complaint  Patient presents with   Flank Pain   GI Bleeding    Anthony Preston is a 39 y.o. male.  Patient is a 39 year old male with a significant past medical history for hypertension, kidney stones, polysubstance use with daily marijuana and methamphetamine use in the last few weeks who is presenting today with multiple complaints.  Patient reports that he just generally feels bad and he has not been sleeping.  He reports that he is having some discomfort in bilateral back area which has been going on for a while but then yesterday noticed swelling in his right lower extremity with some pain in the right lower leg.  He has not had any fever, abdominal pain or vomiting.  He has had a persistent cough with intermittent shortness of breath that is been present for a while.  Also patient has noticed a rash for several weeks now that does involve his genital area and his palms and soles.  He does admit to being sexually active with many partners most likely greater than 10 in the last month and intermittently uses protection.  He only has sex with women.  He has had no dysuria frequency or urgency.  He has been taking ibuprofen for the back pain and reports today when he had a bowel movement after wiping he had blood on the toilet paper.  The blood was bright red and he had no rectal pain at the time.  He reports when he was seen here 5 days ago they tested him for syphilis and he is concerned that he does have syphilis but did not have a phone and has not received any treatment.  The history is provided by the patient.  Flank Pain This is a recurrent problem.       Past Medical History:  Diagnosis Date   Hypertension    Renal calculi     There are no problems to display for this patient.   History reviewed. No pertinent surgical history.     No  family history on file.  Social History   Tobacco Use   Smoking status: Current Every Day Smoker    Packs/day: 0.50    Types: Cigarettes   Smokeless tobacco: Never Used  Substance Use Topics   Alcohol use: Yes   Drug use: Yes    Types: Methamphetamines, Marijuana    Comment: Meth smoked at 0530 on 01/04/2019    Home Medications Prior to Admission medications   Medication Sig Start Date End Date Taking? Authorizing Provider  acetaminophen (TYLENOL) 500 MG tablet Take 1,000 mg by mouth every 6 (six) hours as needed for mild pain.    [provider]  aspirin 81 MG chewable tablet Chew 81 mg by mouth daily.    [provider]  hydrochlorothiazide (HYDRODIURIL) 25 MG tablet Take 25 mg by mouth daily.    [provider]  lidocaine (LIDODERM) 5 % Place 2 patches onto the skin daily. Apply 2 patches (1 to each side) to your mid to lower back once per day, Remove & Discard patch within 12 hours. Patient not taking: Reported on 04/22/2019 03/13/19   Petrucelli, Pleas Koch, PA-C  methocarbamol (ROBAXIN) 500 MG tablet Take 1 tablet (500 mg total) by mouth every 8 (eight) hours as needed. Patient not taking: Reported on 04/22/2019 03/13/19   Petrucelli, Pleas Koch, PA-C  fluticasone (FLONASE) 50 MCG/ACT nasal spray Place 2 sprays into both nostrils daily. Patient not taking: Reported on 03/13/2019 12/03/18 03/13/19  Michela Pitcher A, PA-C    Allergies    Patient has no known allergies.  Review of Systems   Review of Systems  Genitourinary: Positive for flank pain.  All other systems reviewed and are negative.   Physical Exam Updated Vital Signs BP (!) 136/96 (BP Location: Right Arm)    Pulse 95    Temp 98.9 F (37.2 C) (Oral)    Resp 19    SpO2 100%   Physical Exam Vitals and nursing note reviewed.  Constitutional:      General: He is not in acute distress.    Appearance: Normal appearance. He is well-developed and normal weight.  HENT:     Head:  Normocephalic and atraumatic.     Mouth/Throat:     Mouth: Mucous membranes are moist.  Eyes:     Conjunctiva/sclera: Conjunctivae normal.     Pupils: Pupils are equal, round, and reactive to light.  Cardiovascular:     Rate and Rhythm: Regular rhythm. Tachycardia present.     Heart sounds: No murmur.  Pulmonary:     Effort: Pulmonary effort is normal. No respiratory distress.     Breath sounds: Normal breath sounds. No wheezing or rales.  Abdominal:     General: There is no distension.     Palpations: Abdomen is soft.     Tenderness: There is no abdominal tenderness. There is no guarding or rebound.  Genitourinary:    Penis: Circumcised. Lesions present.      Rectum: Normal.       Comments: No obvious signs of hemorrhoids and stool appears brown.  No acute blood present on inspection.  No rectal pain. Musculoskeletal:        General: No tenderness. Normal range of motion.     Cervical back: Normal range of motion and neck supple.     Right lower leg: Edema present.       Legs:     Comments: Mild swelling noted to the right lower extremity in the calf and ankle  Skin:    General: Skin is warm and dry.     Findings: Rash present. No erythema.     Comments: Diffuse maculopapular rash generalized over the body involving the palms and soles  Neurological:     General: No focal deficit present.     Mental Status: He is alert and oriented to person, place, and time. Mental status is at baseline.  Psychiatric:        Mood and Affect: Mood normal.        Behavior: Behavior normal.        Thought Content: Thought content normal.     ED Results / Procedures / Treatments   Labs (all labs ordered are listed, but only abnormal results are displayed) Labs Reviewed  URINALYSIS, ROUTINE W REFLEX MICROSCOPIC  COMPREHENSIVE METABOLIC PANEL  CBC  HIV ANTIBODY (ROUTINE TESTING W REFLEX)  POC OCCULT BLOOD, ED  TYPE AND SCREEN  GC/CHLAMYDIA PROBE AMP ([place]) NOT AT [facility]     EKG None  Radiology DG Chest Port 1 View  Result Date: 04/26/2019 CLINICAL DATA:  Cough and shortness of breath EXAM: PORTABLE CHEST 1 VIEW COMPARISON:  December 03, 2018 FINDINGS: Lungs are clear. Heart size and pulmonary vascularity are normal. No adenopathy. No bone lesions. IMPRESSION: No abnormality noted. Electronically Signed   By: Bretta Bang  III M.D.   On: 04/26/2019 14:17   VAS [place] LOWER EXTREMITY VENOUS (DVT) (ONLY [facility] & [facility] 7a-7p)  Result Date: 04/26/2019  Lower Venous DVTStudy Indications: Edema.  Comparison Study: no prior Performing Technologist: Abram Sander RVS  Examination Guidelines: A complete evaluation includes B-mode imaging, spectral Doppler, color Doppler, and power Doppler as needed of all accessible portions of each vessel. Bilateral testing is considered an integral part of a complete examination. Limited examinations for reoccurring indications may be performed as noted. The reflux portion of the exam is performed with the patient in reverse Trendelenburg.  +---------+---------------+---------+-----------+----------+--------------+  RIGHT     Compressibility Phasicity Spontaneity Properties Thrombus Aging  +---------+---------------+---------+-----------+----------+--------------+  CFV       Full            Yes       Yes                                    +---------+---------------+---------+-----------+----------+--------------+  SFJ       Full                                                             +---------+---------------+---------+-----------+----------+--------------+  FV Prox   Full                                                             +---------+---------------+---------+-----------+----------+--------------+  FV Mid    Full                                                             +---------+---------------+---------+-----------+----------+--------------+  FV Distal Full                                                              +---------+---------------+---------+-----------+----------+--------------+  PFV       Full                                                             +---------+---------------+---------+-----------+----------+--------------+  POP       Full            Yes       Yes                                    +---------+---------------+---------+-----------+----------+--------------+  PTV       Full                                                             +---------+---------------+---------+-----------+----------+--------------+  PERO      Full                                                             +---------+---------------+---------+-----------+----------+--------------+   +----+---------------+---------+-----------+----------+--------------+  LEFT Compressibility Phasicity Spontaneity Properties Thrombus Aging  +----+---------------+---------+-----------+----------+--------------+  CFV  Full            Yes       Yes                                    +----+---------------+---------+-----------+----------+--------------+     Summary: RIGHT: - There is no evidence of deep vein thrombosis in the lower extremity.  - No cystic structure found in the popliteal fossa.  LEFT: - No evidence of common femoral vein obstruction.  *See table(s) above for measurements and observations.    Preliminary     Procedures Procedures (including critical care time)  Medications Ordered in ED Medications  lactated ringers bolus 1,000 mL (has no administration in time range)    ED Course  I have reviewed the triage vital signs and the nursing notes.  Pertinent labs & imaging results that were available during my care of the patient were reviewed by me and considered in my medical decision making (see chart for details).    MDM Rules/Calculators/A&P                      Patient presenting today with multiple complaints and reports he has not been taking care of himself.  Patient has complaints of bilateral flank  pain, new right lower extremity swelling and concern for blood in the stool.  Most likely internal hemorrhoids as patient has no dark blood or change in stool color.  Stool is brown on exam and has no external hemorrhoids present.  Patient has a diffuse rash that is concerning for syphilis and had a reactive T. pallidum and RPR done 4 days ago but has not received treatment.  Titer was 1:32.  Will discuss with ID about course of treatment.  Patient also complained of cough and shortness of breath but he is satting 100% on room air has had a prior history of Covid only a few months ago and x-ray is clear.  He has no wheezing on exam concerning for asthma or COPD.  Patient has been using meth daily and has not been sleeping.  He is mildly tachycardic but is most likely related to meth use.  He does have some swelling of the lower extremity but DVT ultrasound is negative and chest x-ray is clear.  Low suspicion for PE at this time and think the swelling is most likely related to cellulitis and small abscess on the right lower extremity most likely related to excoriation from scratching from the syphilis.  Will ensure normal renal function and LFTs.    3:05 PM Spoke with Dr. Orvan Falconerampbell with infectious disease and he recommended doing the IM penicillin 2,400,000 units but given patient is not having significant penile discharge we will hold on treating for GC and chlamydia until testing comes back.  He can follow-up with the health department and if HIV testing does come back positive infectious disease clinic will follow up with the  patient.  At this time labs are still pending. MDM Number of Diagnoses or Management Options   Amount and/or Complexity of Data Reviewed Clinical lab tests: ordered and reviewed Tests in the radiology section of CPT: ordered and reviewed Tests in the medicine section of CPT: ordered and reviewed Discussion of test results with the performing providers: yes Decide to obtain  previous medical records or to obtain history from someone other than the patient: yes Obtain history from someone other than the patient: no Review and summarize past medical records: yes Discuss the patient with other providers: yes Independent visualization of images, tracings, or specimens: yes  Risk of Complications, Morbidity, and/or Mortality Presenting problems: moderate Diagnostic procedures: minimal Management options: moderate  Patient Progress Patient progress: stable    Final Clinical Impression(s) / ED Diagnoses Final diagnoses:  None    Rx / DC Orders ED Discharge Orders    None       Gwyneth Sprout, MD 04/26/19 1455    Gwyneth Sprout, MD 04/28/19 [phone]

## 2019-04-26 NOTE — ED Notes (Signed)
Pt made aware of need of urine sample  

## 2019-04-26 NOTE — ED Provider Notes (Signed)
  Physical Exam  BP (!) 159/102   Pulse 100   Temp 98.9 F (37.2 C) (Oral)   Resp 19   SpO2 100%   Physical Exam  ED Course/Procedures     Procedures  MDM  Received patient in signout.  Secondary syphilis.  Reported GI bleeding.  Does have an anemia but vitals reassuring.  Has been on a lot of Motrin.  Will treat with proton pump inhibitor and GI follow-up.  Also cellulitis of leg.  Will treat with doxycycline.  Health department follow-up for syphilis.  Discharge home       Benjiman Core, MD 04/26/19 1730

## 2019-04-26 NOTE — Progress Notes (Signed)
Lower extremity venous has been completed.   Preliminary results in CV Proc.   Blanch Media 04/26/2019 2:40 PM

## 2019-04-28 LAB — GC/CHLAMYDIA PROBE AMP (~~LOC~~) NOT AT ARMC
Chlamydia: NEGATIVE
Comment: NEGATIVE
Comment: NORMAL
Neisseria Gonorrhea: NEGATIVE

## 2020-09-25 ENCOUNTER — Encounter (HOSPITAL_COMMUNITY): Payer: Self-pay

## 2020-09-25 ENCOUNTER — Emergency Department (HOSPITAL_COMMUNITY)
Admission: EM | Admit: 2020-09-25 | Discharge: 2020-09-25 | Disposition: A | Discharge status: Home / Self Care | Payer: Self-pay | Attending: Emergency Medicine | Admitting: Emergency Medicine

## 2020-09-25 ENCOUNTER — Other Ambulatory Visit: Payer: Self-pay

## 2020-09-25 ENCOUNTER — Emergency Department (HOSPITAL_COMMUNITY): Payer: Self-pay

## 2020-09-25 DIAGNOSIS — F17210 Nicotine dependence, cigarettes, uncomplicated: Secondary | ICD-10-CM | POA: Insufficient documentation

## 2020-09-25 DIAGNOSIS — Y92410 Unspecified street and highway as the place of occurrence of the external cause: Secondary | ICD-10-CM | POA: Insufficient documentation

## 2020-09-25 DIAGNOSIS — I10 Essential (primary) hypertension: Secondary | ICD-10-CM | POA: Insufficient documentation

## 2020-09-25 DIAGNOSIS — S92251A Displaced fracture of navicular [scaphoid] of right foot, initial encounter for closed fracture: Secondary | ICD-10-CM | POA: Insufficient documentation

## 2020-09-25 DIAGNOSIS — M79673 Pain in unspecified foot: Secondary | ICD-10-CM

## 2020-09-25 DIAGNOSIS — Z7982 Long term (current) use of aspirin: Secondary | ICD-10-CM | POA: Insufficient documentation

## 2020-09-25 MED ORDER — ONDANSETRON 4 MG PO TBDP
4.0000 mg | ORAL_TABLET | Freq: Once | ORAL | Status: AC
  Administered 2020-09-25: 4 mg via ORAL
  Filled 2020-09-25: qty 1

## 2020-09-25 MED ORDER — HYDROCODONE-ACETAMINOPHEN 5-325 MG PO TABS
1.0000 | ORAL_TABLET | Freq: Once | ORAL | Status: AC
Start: 2020-09-25 — End: 2020-09-25
  Administered 2020-09-25: 1 via ORAL
  Filled 2020-09-25: qty 1
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
## 2020-09-25 NOTE — Discharge Instructions (Addendum)
You were seen in the emergency department today for right foot pain. As we discussed you broke one of the bones in your right foot. Please take Tylenol/Ibuprofen as needed for pain and swelling. Ice therapy for 20 minutes then rest for 20 minutes for the first 3 days.   Please return to the ED if you are experiencing worsening pain, swelling, numbness/weakness to the foot or toes, significant soft tissue swelling, or any other concerns you might have.   Please call and make an appointment with Dr. Toni Arthurs at [facility] at your earliest convenience for further evaluation.
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
## 2021-02-07 ENCOUNTER — Emergency Department (HOSPITAL_COMMUNITY): Payer: Self-pay

## 2021-02-07 ENCOUNTER — Emergency Department (HOSPITAL_COMMUNITY)
Admission: EM | Admit: 2021-02-07 | Discharge: 2021-02-07 | Disposition: A | Discharge status: Home / Self Care | Payer: Self-pay | Attending: Student | Admitting: Student

## 2021-02-07 DIAGNOSIS — Z7982 Long term (current) use of aspirin: Secondary | ICD-10-CM | POA: Insufficient documentation

## 2021-02-07 DIAGNOSIS — J690 Pneumonitis due to inhalation of food and vomit: Secondary | ICD-10-CM | POA: Insufficient documentation

## 2021-02-07 DIAGNOSIS — T43655A Adverse effect of methamphetamines, initial encounter: Secondary | ICD-10-CM

## 2021-02-07 DIAGNOSIS — R569 Unspecified convulsions: Secondary | ICD-10-CM | POA: Insufficient documentation

## 2021-02-07 DIAGNOSIS — T43625A Adverse effect of amphetamines, initial encounter: Secondary | ICD-10-CM | POA: Insufficient documentation

## 2021-02-07 LAB — CBC WITH DIFFERENTIAL/PLATELET
Abs Immature Granulocytes: 0.08 10*3/uL — ABNORMAL HIGH (ref 0.00–0.07)
Basophils Absolute: 0.1 10*3/uL (ref 0.0–0.1)
Basophils Relative: 1 %
Eosinophils Absolute: 0.1 10*3/uL (ref 0.0–0.5)
Eosinophils Relative: 1 %
HCT: 42.3 % (ref 39.0–52.0)
Hemoglobin: 14.5 g/dL (ref 13.0–17.0)
Immature Granulocytes: 1 %
Lymphocytes Relative: 15 %
Lymphs Abs: 1.6 10*3/uL (ref 0.7–4.0)
MCH: 25.2 pg — ABNORMAL LOW (ref 26.0–34.0)
MCHC: 34.3 g/dL (ref 30.0–36.0)
MCV: 73.6 fL — ABNORMAL LOW (ref 80.0–100.0)
Monocytes Absolute: 0.7 10*3/uL (ref 0.1–1.0)
Monocytes Relative: 6 %
Neutro Abs: 8.4 10*3/uL — ABNORMAL HIGH (ref 1.7–7.7)
Neutrophils Relative %: 76 %
Platelets: 437 10*3/uL — ABNORMAL HIGH (ref 150–400)
RBC: 5.75 MIL/uL (ref 4.22–5.81)
RDW: 15.4 % (ref 11.5–15.5)
WBC: 10.9 10*3/uL — ABNORMAL HIGH (ref 4.0–10.5)
nRBC: 0 % (ref 0.0–0.2)

## 2021-02-07 LAB — COMPREHENSIVE METABOLIC PANEL
ALT: 22 U/L (ref 0–44)
AST: 41 U/L (ref 15–41)
Albumin: 3.4 g/dL — ABNORMAL LOW (ref 3.5–5.0)
Alkaline Phosphatase: 79 U/L (ref 38–126)
Anion gap: 10 (ref 5–15)
BUN: 16 mg/dL (ref 6–20)
CO2: 26 mmol/L (ref 22–32)
Calcium: 8.6 mg/dL — ABNORMAL LOW (ref 8.9–10.3)
Chloride: 106 mmol/L (ref 98–111)
Creatinine, Ser: 1.16 mg/dL (ref 0.61–1.24)
GFR, Estimated: >60 mL/min (ref >60)
Glucose, Bld: 106 mg/dL — ABNORMAL HIGH (ref 70–99)
Potassium: 3.8 mmol/L (ref 3.5–5.1)
Sodium: 142 mmol/L (ref 135–145)
Total Bilirubin: 0.3 mg/dL (ref 0.3–1.2)
Total Protein: 6.7 g/dL (ref 6.5–8.1)

## 2021-02-07 LAB — CBG MONITORING, ED: Glucose-Capillary: 106 mg/dL — ABNORMAL HIGH (ref 70–99)

## 2021-02-07 MED ORDER — ONDANSETRON HCL 4 MG/2ML IJ SOLN
4.0000 mg | Freq: Once | INTRAMUSCULAR | Status: AC
  Administered 2021-02-07: 4 mg via INTRAVENOUS
  Filled 2021-02-07: qty 2

## 2021-02-07 MED ORDER — KETOROLAC TROMETHAMINE 30 MG/ML IJ SOLN
30.0000 mg | Freq: Once | INTRAMUSCULAR | Status: DC
  Filled 2021-02-07: qty 1

## 2021-02-07 MED ORDER — DROPERIDOL 2.5 MG/ML IJ SOLN: 1.2500 mg | Freq: Once | INTRAMUSCULAR | Status: DC

## 2021-02-07 MED ORDER — AMOXICILLIN-POT CLAVULANATE 875-125 MG PO TABS: 1.0000 | ORAL_TABLET | Freq: Two times a day (BID) | ORAL | 0 refills | Status: AC
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
## 2021-02-07 NOTE — ED Provider Notes (Signed)
[facility] EMERGENCY DEPARTMENT Provider Note   CSN: [id number] Arrival date & time: 02/07/21  0762     History  Chief Complaint  Patient presents with   Seizures    Anthony Preston is a 41 y.o. male.  Patient with history of hypertension presents today brought in by EMS with concerns for seizures. EMS states that the patients fiance who was present on the scene stated that they were smoking meth and THC around 3 am this morning. Fiance awoke to find the patient unresponsive on the floor. Fiance then gave 16 mg Narcan without change in symptoms and called EMS with concern for seizure-like activity. Per EMS pt found in bed, pt "locked up" lasting about 2-3 mins with foaming from mouth. Patient is currently confused and mumbling inaudibly, is alert and oriented x 2 and moving all extremities equally.     The history is provided by the patient and the EMS personnel. No language interpreter was used.  Seizures     Home Medications Prior to Admission medications   Medication Sig Start Date End Date Taking? Authorizing Provider  acetaminophen (TYLENOL) 500 MG tablet Take 1,000 mg by mouth every 6 (six) hours as needed for mild pain.    [provider]  aspirin 81 MG chewable tablet Chew 81 mg by mouth daily.    [provider]  doxycycline (VIBRAMYCIN) 100 MG capsule Take 1 capsule (100 mg total) by mouth 2 (two) times daily. 04/26/19   Benjiman Core, MD  hydrochlorothiazide (HYDRODIURIL) 25 MG tablet Take 25 mg by mouth daily.    [provider]  lidocaine (LIDODERM) 5 % Place 2 patches onto the skin daily. Apply 2 patches (1 to each side) to your mid to lower back once per day, Remove & Discard patch within 12 hours. Patient not taking: Reported on 04/22/2019 03/13/19   Petrucelli, Pleas Koch, PA-C  methocarbamol (ROBAXIN) 500 MG tablet Take 1 tablet (500 mg total) by mouth every 8 (eight) hours as needed. Patient not taking: Reported on  04/22/2019 03/13/19   Petrucelli, Pleas Koch, PA-C  omeprazole (PRILOSEC) 20 MG capsule Take 1 capsule (20 mg total) by mouth daily. 04/26/19   Benjiman Core, MD  fluticasone (FLONASE) 50 MCG/ACT nasal spray Place 2 sprays into both nostrils daily. Patient not taking: Reported on 03/13/2019 12/03/18 03/13/19  Michela Pitcher A, PA-C      Allergies    Patient has no known allergies.    Review of Systems   Review of Systems  Constitutional:  Negative for chills and fever.  Respiratory:  Negative for chest tightness, shortness of breath, wheezing and stridor.   Cardiovascular:  Negative for chest pain.  Gastrointestinal:  Negative for abdominal pain, diarrhea, nausea and vomiting.  Neurological:  Positive for seizures. Negative for headaches.  All other systems reviewed and are negative.  Physical Exam Updated Vital Signs BP (!) 150/108    Pulse (!) 105    Temp 97.6 F (36.4 C) (Oral)    Resp 17    SpO2 92%  Physical Exam Vitals and nursing note reviewed.  Constitutional:      General: He is not in acute distress.    Appearance: Normal appearance. He is normal weight. He is not ill-appearing, toxic-appearing or diaphoretic.  HENT:     Head: Normocephalic and atraumatic.     Nose: Nose normal.     Mouth/Throat:     Mouth: Mucous membranes are moist.  Eyes:     Extraocular  Movements: Extraocular movements intact.     Conjunctiva/sclera: Conjunctivae normal.     Pupils: Pupils are equal, round, and reactive to light.  Cardiovascular:     Rate and Rhythm: Normal rate and regular rhythm.     Heart sounds: Normal heart sounds.  Pulmonary:     Effort: Pulmonary effort is normal. No respiratory distress.     Breath sounds: Normal breath sounds.  Abdominal:     General: Abdomen is flat.     Palpations: Abdomen is soft.     Tenderness: There is no abdominal tenderness.  Musculoskeletal:        General: Normal range of motion.     Cervical back: Normal range of motion and neck supple. No  tenderness.  Skin:    General: Skin is warm and dry.  Neurological:     General: No focal deficit present.     Mental Status: He is alert.  Psychiatric:        Mood and Affect: Mood normal.        Behavior: Behavior normal.    ED Results / Procedures / Treatments   Labs (all labs ordered are listed, but only abnormal results are displayed) Labs Reviewed  CBC WITH DIFFERENTIAL/PLATELET - Abnormal; Notable for the following components:      Result Value   WBC 10.9 (*)    MCV 73.6 (*)    MCH 25.2 (*)    Platelets 437 (*)    Neutro Abs 8.4 (*)    Abs Immature Granulocytes 0.08 (*)    All other components within normal limits  COMPREHENSIVE METABOLIC PANEL - Abnormal; Notable for the following components:   Glucose, Bld 106 (*)    Calcium 8.6 (*)    Albumin 3.4 (*)    All other components within normal limits  CBG MONITORING, ED - Abnormal; Notable for the following components:   Glucose-Capillary 106 (*)    All other components within normal limits  URINALYSIS, ROUTINE W REFLEX MICROSCOPIC  RAPID URINE DRUG SCREEN, HOSP PERFORMED    EKG None  Radiology CT Head Wo Contrast  Result Date: 02/07/2021 CLINICAL DATA:  Seizure. New onset. No history of trauma. Mental status change. Unknown cause. EXAM: CT HEAD WITHOUT CONTRAST TECHNIQUE: Contiguous axial images were obtained from the base of the skull through the vertex without intravenous contrast. RADIATION DOSE REDUCTION: This exam was performed according to the departmental dose-optimization program which includes automated exposure control, adjustment of the mA and/or kV according to patient size and/or use of iterative reconstruction technique. COMPARISON:  None. FINDINGS: Despite efforts by the technologist and patient, motion artifact is present on today's exam and could not be eliminated. This reduces exam sensitivity and specificity. The technologist reports the patient was asleep for this exam. The exam was performed twice  to reduce motion. Brain: The ventricles are normal in size and configuration. The basilar cisterns are patent. No mass, mass effect, or midline shift. No acute intracranial hemorrhage is seen. No abnormal extra-axial fluid collection. Preservation of the normal cortical gray-white interface without CT evidence of an acute major vascular territorial cortical based infarction. Vascular: No hyperdense vessel or unexpected calcification. Skull: Normal. Negative for fracture or focal lesion. Sinuses/Orbits: The visualized orbits are unremarkable. The visualized paranasal sinuses are clear. There is mild mucosal opacification of a few bilateral ethmoid air cells. The calvarium is intact. Other: None. IMPRESSION: No acute intracranial process. Electronically Signed   By: Neita Garnetonald  Viola M.D.   On: 02/07/2021 08:22  Procedures Procedures    Medications Ordered in ED Medications - No data to display  ED Course/ Medical Decision Making/ A&P                           Medical Decision Making Amount and/or Complexity of Data Reviewed Labs: ordered. Radiology: ordered.   This patient presents to the ED for concern of seizure, this involves an extensive number of treatment options, and is a complaint that carries with it a high risk of complications and morbidity.  The differential diagnosis includes CVA, intracranial hemorrhage, intracranial mass, seizure following meth use   Lab Tests:  I Ordered, and personally interpreted labs.  The pertinent results include:  Mild leukocytosis at 10.9. No anemia. CMP without significant abnormalities   Imaging Studies ordered:  I ordered imaging studies including CXR, CT head  I independently visualized and interpreted imaging which showed no acute intracranial process CXR reveals R lower lobe pneumonia vs aspiration with questionable left infrahilar infiltrate I agree with the radiologist interpretation   Cardiac Monitoring:  The patient was maintained on  a cardiac monitor.  I personally viewed and interpreted the cardiac monitored which showed an underlying rhythm of: NSR    Reevaluation:  After the interventions noted above, I reevaluated the patient and found that they have :improved   Dispostion:  After consideration of the diagnostic results and the patients response to treatment, I feel that the patent would benefit from outpatient management.   Patient presents today after smoking methamphetamines and becoming subsequently unresponsive.  Upon my evaluation after EMS transport to the ER, patient is alert and oriented however also mumbling inaudibly, and having difficulty staying awake.  He is neurologically intact and moving all extremities without difficulty.  Upon reevaluation, patient is becoming more alert and awake.  Given water and observed to swallow without difficulty, however nursing states that soon after drinking water he projectile vomited.  He denies any pain at this time.  Given Zofran, he endorses continued nausea considered droperidol in the setting of nausea/vomiting with THC use, however it was noted that patient has prolonged QT interval.  Therefore will hold off on this.  After reevaluation, patient without any subsequent vomiting episodes.  He states that he is now feeling back to normal.  Observed to drink water without any nausea or vomiting.  He is ambulatory in the department with a steady gait.  His oxygen saturation does not drop on room air.  In the setting of negative work-up, suspect the patient's seizure-like activity was due to his methamphetamine use this morning.  He has not had any subsequent episodes in the department today.  Therefore do not feel that further evaluation or management of this is required.  Plan to treat him with Augmentin for aspiration pneumonia.  He is afebrile, nontoxic-appearing, in no acute distress with reassuring vital signs.  Stable for discharge at this time.  Educated on red flag  symptoms of prompt immediate return.  Discharged in stable condition.   This is a shared visit with supervising physician Dr. Posey ReaKommor who has independently evaluated patient & provided guidance in evaluation/management/disposition, in agreement with care   Final Clinical Impression(s) / ED Diagnoses Final diagnoses:  Seizure (HCC)  Adverse effect of methamphetamine  Aspiration pneumonia of right lower lobe, unspecified aspiration pneumonia type [facility](HCC)    Rx / DC Orders ED Discharge Orders          Ordered  amoxicillin-clavulanate (AUGMENTIN) 875-125 MG tablet  Every 12 hours        02/07/21 1405          An After Visit Summary was printed and given to the patient.     Vear Clock 02/07/21 1417    Glendora Score, MD 02/07/21 1620

## 2021-02-07 NOTE — Discharge Instructions (Addendum)
Your work-up in the ER today revealed that during her episode earlier today you likely aspirated causing concern for pneumonia.  I have written you a prescription for antibiotics for management of this.  Please take entire dose of this as prescribed.  Otherwise, your work-up was unremarkable for acute abnormalities.  I suspect that your episode this morning was related to use of methamphetamines.  I would strongly recommend avoiding methamphetamines in the future to prevent recurrence of this.  As we discussed, 1 isolated seizure in the setting of known cause does not warrant any sort of further work-up or medication management.  Return if development of any new or worsening symptoms.

## 2021-12-22 ENCOUNTER — Emergency Department (HOSPITAL_COMMUNITY)
Admission: EM | Admit: 2021-12-22 | Discharge: 2021-12-23 | Disposition: A | Discharge status: Home / Self Care | Payer: Self-pay | Attending: Emergency Medicine | Admitting: Emergency Medicine

## 2021-12-22 ENCOUNTER — Other Ambulatory Visit: Payer: Self-pay

## 2021-12-22 ENCOUNTER — Encounter (HOSPITAL_COMMUNITY): Payer: Self-pay

## 2021-12-22 ENCOUNTER — Emergency Department (HOSPITAL_COMMUNITY): Payer: Self-pay

## 2021-12-22 DIAGNOSIS — J181 Lobar pneumonia, unspecified organism: Secondary | ICD-10-CM | POA: Insufficient documentation

## 2021-12-22 DIAGNOSIS — F17210 Nicotine dependence, cigarettes, uncomplicated: Secondary | ICD-10-CM | POA: Insufficient documentation

## 2021-12-22 DIAGNOSIS — R042 Hemoptysis: Secondary | ICD-10-CM | POA: Insufficient documentation

## 2021-12-22 DIAGNOSIS — J189 Pneumonia, unspecified organism: Secondary | ICD-10-CM

## 2021-12-22 DIAGNOSIS — J101 Influenza due to other identified influenza virus with other respiratory manifestations: Secondary | ICD-10-CM | POA: Insufficient documentation

## 2021-12-22 DIAGNOSIS — Z1152 Encounter for screening for COVID-19: Secondary | ICD-10-CM | POA: Insufficient documentation

## 2021-12-22 HISTORY — DX: Calculus of gallbladder without cholecystitis without obstruction: K80.20

## 2021-12-22 LAB — CBC WITH DIFFERENTIAL/PLATELET
Abs Immature Granulocytes: 0.06 10*3/uL (ref 0.00–0.07)
Basophils Absolute: 0.1 10*3/uL (ref 0.0–0.1)
Basophils Relative: 1 %
Eosinophils Absolute: 0 10*3/uL (ref 0.0–0.5)
Eosinophils Relative: 0 %
HCT: 43.4 % (ref 39.0–52.0)
Hemoglobin: 14.4 g/dL (ref 13.0–17.0)
Immature Granulocytes: 1 %
Lymphocytes Relative: 5 %
Lymphs Abs: 0.6 10*3/uL — ABNORMAL LOW (ref 0.7–4.0)
MCH: 24.7 pg — ABNORMAL LOW (ref 26.0–34.0)
MCHC: 33.2 g/dL (ref 30.0–36.0)
MCV: 74.3 fL — ABNORMAL LOW (ref 80.0–100.0)
Monocytes Absolute: 0.5 10*3/uL (ref 0.1–1.0)
Monocytes Relative: 4 %
Neutro Abs: 10.5 10*3/uL — ABNORMAL HIGH (ref 1.7–7.7)
Neutrophils Relative %: 89 %
Platelets: 231 10*3/uL (ref 150–400)
RBC: 5.84 MIL/uL — ABNORMAL HIGH (ref 4.22–5.81)
RDW: 14.1 % (ref 11.5–15.5)
WBC: 11.7 10*3/uL — ABNORMAL HIGH (ref 4.0–10.5)
nRBC: 0 % (ref 0.0–0.2)

## 2021-12-22 LAB — BASIC METABOLIC PANEL
Anion gap: 9 (ref 5–15)
BUN: 13 mg/dL (ref 6–20)
CO2: 23 mmol/L (ref 22–32)
Calcium: 8.1 mg/dL — ABNORMAL LOW (ref 8.9–10.3)
Chloride: 98 mmol/L (ref 98–111)
Creatinine, Ser: 1.05 mg/dL (ref 0.61–1.24)
GFR, Estimated: >60 mL/min (ref >60)
Glucose, Bld: 106 mg/dL — ABNORMAL HIGH (ref 70–99)
Potassium: 3 mmol/L — ABNORMAL LOW (ref 3.5–5.1)
Sodium: 130 mmol/L — ABNORMAL LOW (ref 135–145)

## 2021-12-22 LAB — RESP PANEL BY RT-PCR (FLU A&B, COVID) ARPGX2
Influenza A by PCR: POSITIVE — AB
Influenza B by PCR: NEGATIVE
SARS Coronavirus 2 by RT PCR: NEGATIVE

## 2021-12-22 LAB — LACTIC ACID, PLASMA
Lactic Acid, Venous: 1.1 mmol/L (ref 0.5–1.9)
Lactic Acid, Venous: 0.8 mmol/L (ref 0.5–1.9)

## 2021-12-22 MED ORDER — POTASSIUM CHLORIDE CRYS ER 20 MEQ PO TBCR
40.0000 meq | EXTENDED_RELEASE_TABLET | Freq: Once | ORAL | Status: AC
  Administered 2021-12-22: 40 meq via ORAL
  Filled 2021-12-22: qty 2

## 2021-12-22 MED ORDER — AZITHROMYCIN 250 MG PO TABS: 250.0000 mg | ORAL_TABLET | Freq: Every day | ORAL | 0 refills | Status: AC

## 2021-12-22 MED ORDER — SODIUM CHLORIDE 0.9 % IV SOLN
500.0000 mg | Freq: Once | INTRAVENOUS | Status: AC
  Administered 2021-12-22: 500 mg via INTRAVENOUS
  Filled 2021-12-22: qty 5

## 2021-12-22 MED ORDER — SODIUM CHLORIDE 0.9 % IV BOLUS
1000.0000 mL | Freq: Once | INTRAVENOUS | Status: AC
  Administered 2021-12-22: 1000 mL via INTRAVENOUS

## 2021-12-22 MED ORDER — ACETAMINOPHEN 325 MG PO TABS
650.0000 mg | ORAL_TABLET | Freq: Once | ORAL | Status: AC
  Administered 2021-12-22: 650 mg via ORAL
  Filled 2021-12-22: qty 2

## 2021-12-22 MED ORDER — KETOROLAC TROMETHAMINE 15 MG/ML IJ SOLN
15.0000 mg | Freq: Once | INTRAMUSCULAR | Status: AC
  Administered 2021-12-22: 15 mg via INTRAVENOUS
  Filled 2021-12-22: qty 1

## 2021-12-22 MED ORDER — SODIUM CHLORIDE 0.9 % IV SOLN
1.0000 g | Freq: Once | INTRAVENOUS | Status: AC
  Administered 2021-12-22: 1 g via INTRAVENOUS
  Filled 2021-12-22: qty 10

## 2021-12-22 MED ORDER — LACTATED RINGERS IV BOLUS
1000.0000 mL | Freq: Once | INTRAVENOUS | Status: AC
  Administered 2021-12-22: 1000 mL via INTRAVENOUS
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
## 2021-12-22 NOTE — Discharge Instructions (Signed)
Work-up here today significant for influenza A and for left lower lobe pneumonia.  You received IV antibiotics here today continue the oral antibiotics sent to the [facility] pharmacy tomorrow.  Drink plenty of fluids rest Tylenol as needed for body aches.  Return for any new or worse symptoms.

## 2021-12-22 NOTE — ED Provider Notes (Signed)
[facility]EMERGENCY DEPT Provider Note   CSN: [id number] Arrival date & time: 12/22/21  1736     History  Chief Complaint  Patient presents with   Hemoptysis   Fever   Weakness    Anthony Preston is a 41 y.o. male.  Patient with a 2-day history of fever weakness body aches cough and coughing up some phlegm with some blood streaks.  Not coughing up.  Blood.  Patient has been sleeping outside the past few days.  Patient tells me he can stay with his mother.  Which gives him a warm place to stay.  Temp on arrival was 101.8 oxygen levels have all been good on room air anywhere from 94% or 97%.  Blood pressure 164/105 heart rate 106 respirations 16.  Past medical history significant for no known allergies.  Hypertension everyday smoker.  History of gallstones.       Home Medications Prior to Admission medications   Medication Sig Start Date End Date Taking? Authorizing Provider  amoxicillin-clavulanate (AUGMENTIN) 875-125 MG tablet Take 1 tablet by mouth every 12 (twelve) hours. 02/07/21   Smoot, Shawn Route, PA-C  hydrochlorothiazide (HYDRODIURIL) 25 MG tablet Take 25 mg by mouth daily.    [provider]  ibuprofen (ADVIL) 100 MG tablet Take 100 mg by mouth every 6 (six) hours as needed for fever.    [provider]  lidocaine (LIDODERM) 5 % Place 2 patches onto the skin daily. Apply 2 patches (1 to each side) to your mid to lower back once per day, Remove & Discard patch within 12 hours. Patient not taking: Reported on 04/22/2019 03/13/19   Petrucelli, Pleas Koch, PA-C  methocarbamol (ROBAXIN) 500 MG tablet Take 1 tablet (500 mg total) by mouth every 8 (eight) hours as needed. Patient not taking: Reported on 04/22/2019 03/13/19   Petrucelli, Pleas Koch, PA-C  omeprazole (PRILOSEC) 20 MG capsule Take 1 capsule (20 mg total) by mouth daily. Patient not taking: Reported on 02/07/2021 04/26/19   Benjiman Core, MD  fluticasone [facility]) 50 MCG/ACT nasal  spray Place 2 sprays into both nostrils daily. Patient not taking: Reported on 03/13/2019 12/03/18 03/13/19  Michela Pitcher A, PA-C      Allergies    Patient has no known allergies.    Review of Systems   Review of Systems  Constitutional:  Positive for fever. Negative for chills.  HENT:  Positive for congestion. Negative for rhinorrhea and sore throat.   Eyes:  Negative for visual disturbance.  Respiratory:  Positive for cough. Negative for shortness of breath.   Cardiovascular:  Negative for chest pain and leg swelling.  Gastrointestinal:  Negative for abdominal pain, diarrhea, nausea and vomiting.  Genitourinary:  Negative for dysuria.  Musculoskeletal:  Positive for myalgias. Negative for back pain and neck pain.  Skin:  Negative for rash.  Neurological:  Positive for weakness. Negative for dizziness, light-headedness and headaches.  Hematological:  Does not bruise/bleed easily.  Psychiatric/Behavioral:  Negative for confusion.     Physical Exam Updated Vital Signs BP (!) 167/111 (BP Location: Left Arm)   Pulse 91   Temp (!) 101.8 F (38.8 C) (Oral)   Resp 20   Ht 1.651 m (5\' 5" )   Wt 68 kg   SpO2 96%   BMI 24.96 kg/m  Physical Exam Vitals and nursing note reviewed.  Constitutional:      General: He is not in acute distress.    Appearance: Normal appearance. He is well-developed.  HENT:  Head: Normocephalic and atraumatic.  Eyes:     Extraocular Movements: Extraocular movements intact.     Conjunctiva/sclera: Conjunctivae normal.     Pupils: Pupils are equal, round, and reactive to light.  Cardiovascular:     Rate and Rhythm: Normal rate and regular rhythm.     Heart sounds: No murmur heard. Pulmonary:     Effort: Pulmonary effort is normal. No respiratory distress.     Breath sounds: Normal breath sounds. No stridor. No wheezing, rhonchi or rales.  Chest:     Chest wall: No tenderness.  Abdominal:     Palpations: Abdomen is soft.     Tenderness: There is no  abdominal tenderness.  Musculoskeletal:        General: No swelling.     Cervical back: Normal range of motion and neck supple. No rigidity.  Skin:    General: Skin is warm and dry.     Capillary Refill: Capillary refill takes less than 2 seconds.  Neurological:     General: No focal deficit present.     Mental Status: He is alert and oriented to person, place, and time.  Psychiatric:        Mood and Affect: Mood normal.     ED Results / Procedures / Treatments   Labs (all labs ordered are listed, but only abnormal results are displayed) Labs Reviewed  RESP PANEL BY RT-PCR (FLU A&B, COVID) ARPGX2 - Abnormal; Notable for the following components:      Result Value   Influenza A by PCR POSITIVE (*)    All other components within normal limits  CBC WITH DIFFERENTIAL/PLATELET - Abnormal; Notable for the following components:   WBC 11.7 (*)    RBC 5.84 (*)    MCV 74.3 (*)    MCH 24.7 (*)    Neutro Abs 10.5 (*)    Lymphs Abs 0.6 (*)    All other components within normal limits  BASIC METABOLIC PANEL - Abnormal; Notable for the following components:   Sodium 130 (*)    Potassium 3.0 (*)    Glucose, Bld 106 (*)    Calcium 8.1 (*)    All other components within normal limits  CULTURE, BLOOD (ROUTINE X 2)  CULTURE, BLOOD (ROUTINE X 2)  LACTIC ACID, PLASMA  LACTIC ACID, PLASMA    EKG None  Radiology DG Chest 2 View  Result Date: 12/22/2021 CLINICAL DATA:  Shortness of breath EXAM: CHEST - 2 VIEW COMPARISON:  Chest x-ray 02/07/2021 FINDINGS: Patchy airspace disease is present in the left lower lobe. No pleural effusion or pneumothorax. Cardiomediastinal silhouette is within normal limits. No acute fractures are seen. IMPRESSION: Patchy airspace disease in the left lower lobe concerning for pneumonia. Follow-up chest x-ray recommended in 4-6 weeks to confirm resolution. Electronically Signed   By: Darliss Cheney M.D.   On: 12/22/2021 18:24    Procedures Procedures     Medications Ordered in ED Medications  cefTRIAXone (ROCEPHIN) 1 g in sodium chloride 0.9 % 100 mL IVPB (has no administration in time range)  azithromycin (ZITHROMAX) 500 mg in sodium chloride 0.9 % 250 mL IVPB (has no administration in time range)  potassium chloride SA (KLOR-CON M) CR tablet 40 mEq (has no administration in time range)  lactated ringers bolus 1,000 mL (has no administration in time range)  acetaminophen (TYLENOL) tablet 650 mg (650 mg Oral Given 12/22/21 1952)  sodium chloride 0.9 % bolus 1,000 mL (1,000 mLs Intravenous New Bag/Given 12/22/21 1952)  ketorolac (  TORADOL) 15 MG/ML injection 15 mg (15 mg Intravenous Given 12/22/21 1953)    ED Course/ Medical Decision Making/ A&P                           Medical Decision Making  Patient is oxygen saturations are very good.  No 94% or better.  Patient's influenza test positive.  Chest x-ray consistent with a left lower lobe pneumonia.  Patient will receive Rocephin azithromycin IV here.  Although it may be a viral pneumonia.  CBC white blood cell white blood cell count 11.7 hemoglobin 14.4 platelets normal 231.  Lactic acid normal at 1.1.  Basic metabolic panel sodium 130 potassium 3.0 patient given oral potassium.  Renal function is normal though.  COVID-negative and influenza a positive.  Patient received IV fluids here.  Patient received Tylenol for fever.  Patient will be reassessed after the IV antibiotics.  But most likely can be discharged home since he has a warm place to go to with precautions.  We will continue azithromycin at home.  CRITICAL CARE Performed by: Vanetta Mulders Total critical care time: 35 minutes Critical care time was exclusive of separately billable procedures and treating other patients. Critical care was necessary to treat or prevent imminent or life-threatening deterioration. Critical care was time spent personally by me on the following activities: development of treatment plan with patient  and/or surrogate as well as nursing, discussions with consultants, evaluation of patient's response to treatment, examination of patient, obtaining history from patient or surrogate, ordering and performing treatments and interventions, ordering and review of laboratory studies, ordering and review of radiographic studies, pulse oximetry and re-evaluation of patient's condition.   Final Clinical Impression(s) / ED Diagnoses Final diagnoses:  Influenza A  Pneumonia of left lower lobe due to infectious organism    Rx / DC Orders ED Discharge Orders     None         Vanetta Mulders, MD 12/22/21 2134

## 2021-12-22 NOTE — ED Provider Triage Note (Signed)
Emergency Medicine Provider Triage Evaluation Note  Anthony Preston , a 41 y.o. male  was evaluated in triage.  Patient complains of shortness of breath and fever since yesterday.  Has a history of bronchitis and smokes half a pack of cigarettes a day.  No known sick contacts.  He also believes that he is coughing up blood.  Review of Systems  Positive: Breath, fever, cough Negative:   Physical Exam  BP (!) 164/105 (BP Location: Left Arm)   Pulse (!) 106   Temp (!) 101.8 F (38.8 C) (Oral)   Resp 16   SpO2 94%  Gen:   Awake, no distress   Resp:  Normal effort  MSK:   Moves extremities without difficulty  Other:  Mild Rales in right lower lung lobe.  Decreased air movement throughout  Medical Decision Making  Medically screening exam initiated at 6:08 PM.  Appropriate orders placed.  Anthony Preston was informed that the remainder of the evaluation will be completed by another provider, this initial triage assessment does not replace that evaluation, and the importance of remaining in the ED until their evaluation is complete.    No recent surgery, history of DVT/PE, recent travel or leg swelling.   Saddie Benders, [place] 12/22/21 1809

## 2021-12-23 MED ORDER — IPRATROPIUM-ALBUTEROL 0.5-2.5 (3) MG/3ML IN SOLN
3.0000 mL | Freq: Once | RESPIRATORY_TRACT | Status: AC
  Administered 2021-12-23: 3 mL via RESPIRATORY_TRACT
  Filled 2021-12-23: qty 3

## 2021-12-23 NOTE — ED Notes (Signed)
Patient c/o [facility] prior to d/c.  Verbal order received and patient medicated per order.  Will continue to monitor

## 2021-12-27 LAB — CULTURE, BLOOD (ROUTINE X 2)
Culture: NO GROWTH
Culture: NO GROWTH
Special Requests: ADEQUATE
Special Requests: ADEQUATE

## 2022-02-01 IMAGING — CT CT HEAD W/O CM
5 of 8 series · 17 of 47 positions shown, 18 images · non-contrast
Comparison: None.

CLINICAL DATA: Seizure. New onset. No history of trauma. Mental
status change. Unknown cause.



[Series 3: head bone · axial · 0.42mm/px · z∈[-96,+26]mm · 8 of 77 slices shown]
[im 8/77  bone]
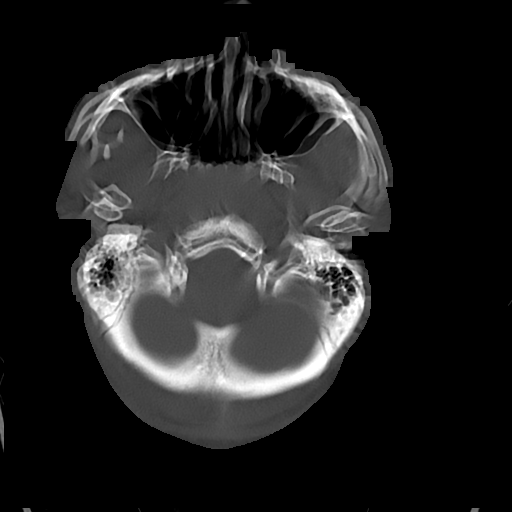
[im 16/77  bone]
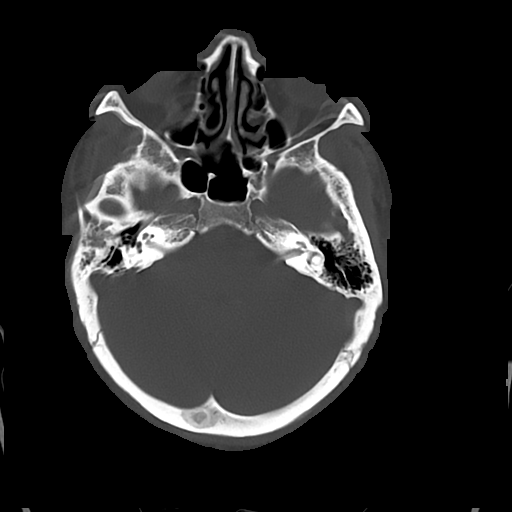
[im 23/77  bone]
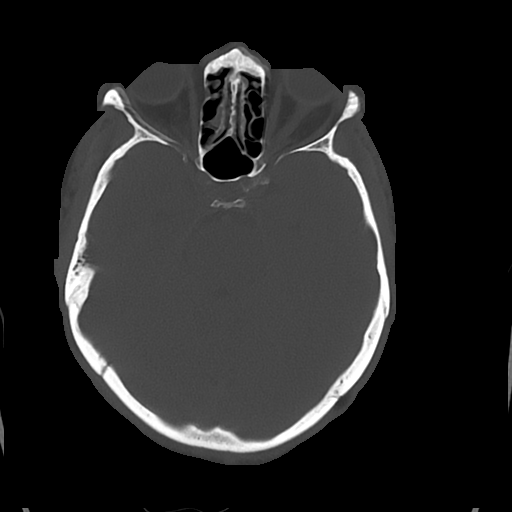
[im 31/77  bone]
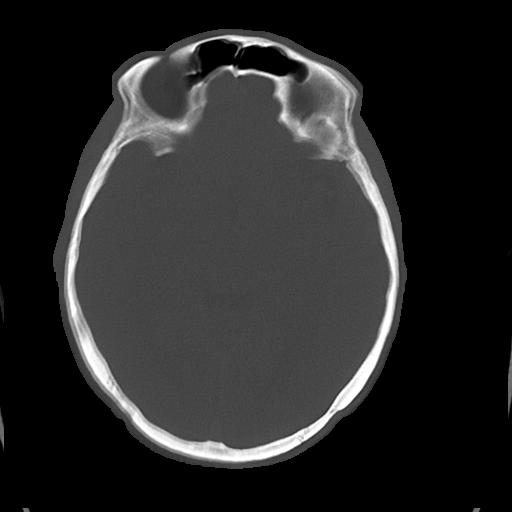
[im 46/77  bone]
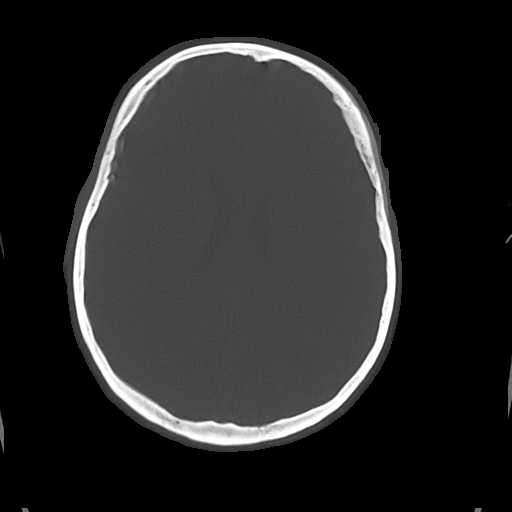
[im 54/77  bone]
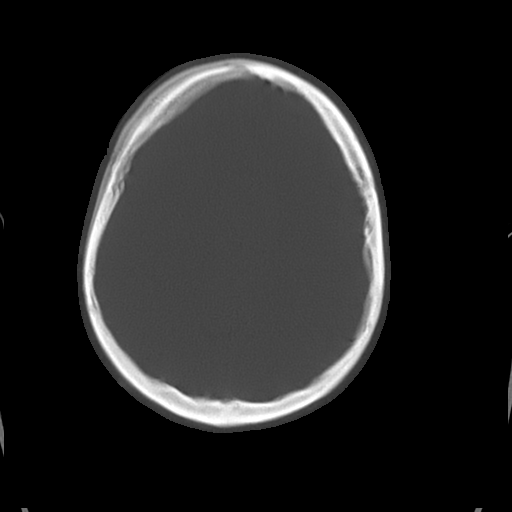
[im 61/77  bone]
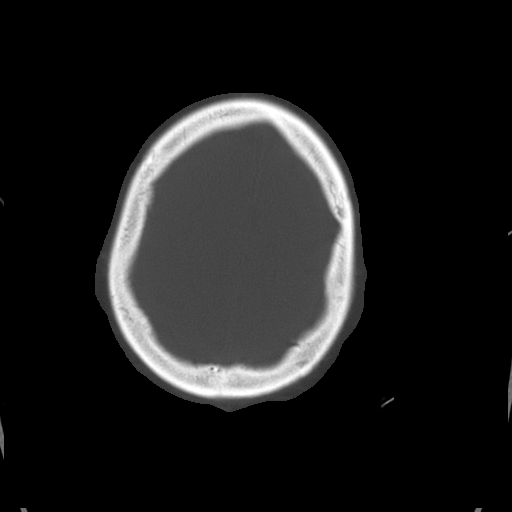
[im 69/77  bone]
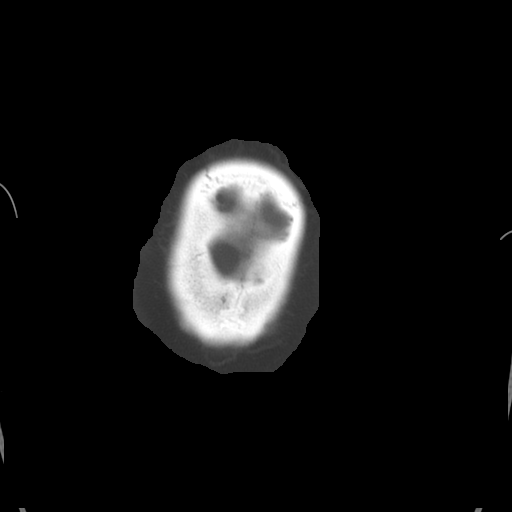

[Series 4: head wo · axial · 0.42mm/px · z∈[-60,-10]mm · 2 of 31 slices shown, 3 images (1 of 2)]
[im 11/31  brain]
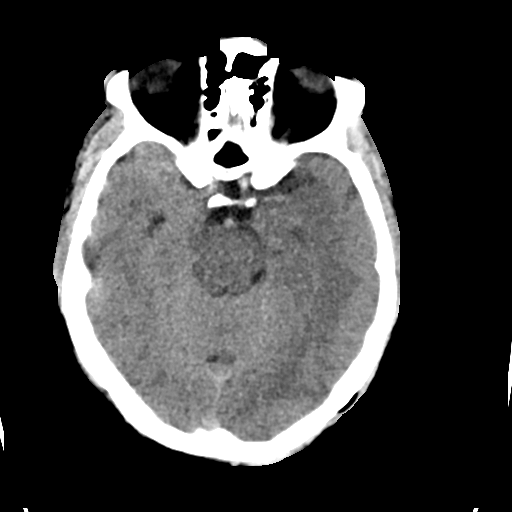
[im 11/31  bone]
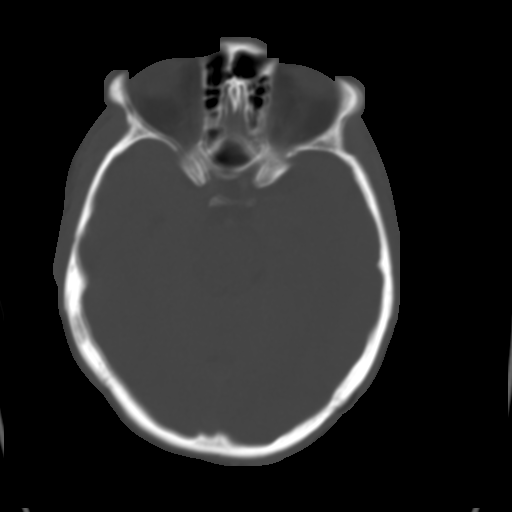
[im 21/31  brain]
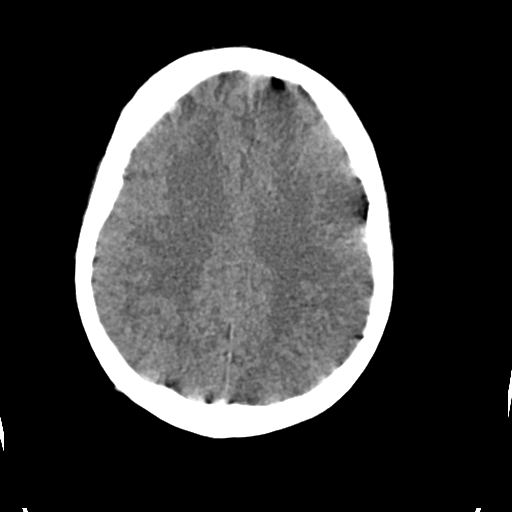

[Series 5: cor soft · coronal · 0.34mm/px · 3 of 66 slices shown]
[im 17/66  brain]
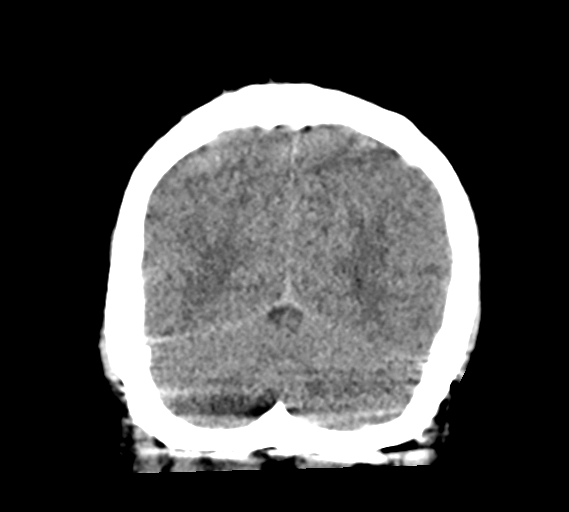
[im 33/66  brain]
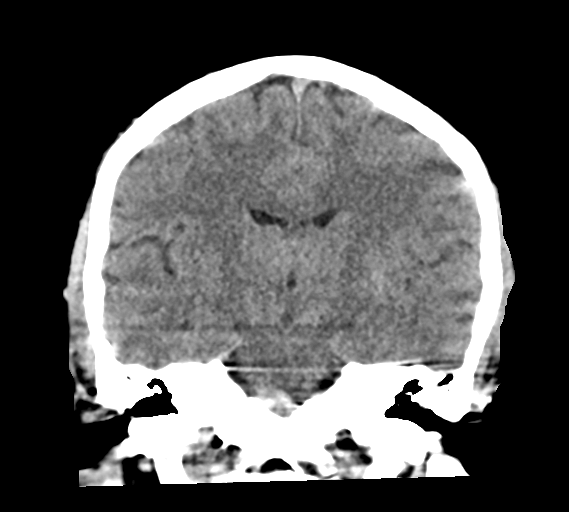
[im 49/66  brain]
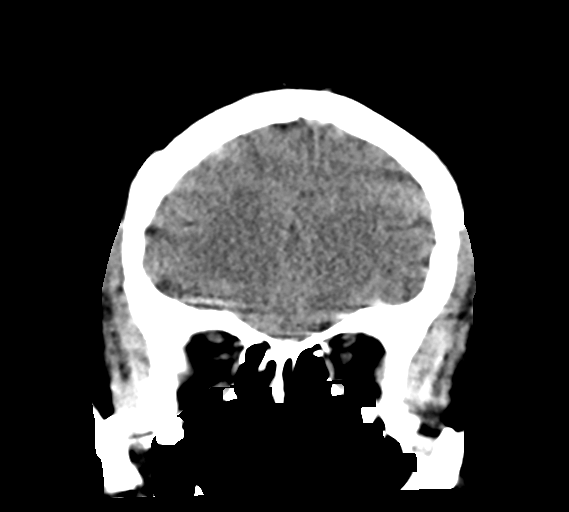

[Series 7: head wo · axial · 0.42mm/px · z∈[-60,-10]mm · 2 of 31 slices shown (2 of 2)]
[im 11/31  brain]
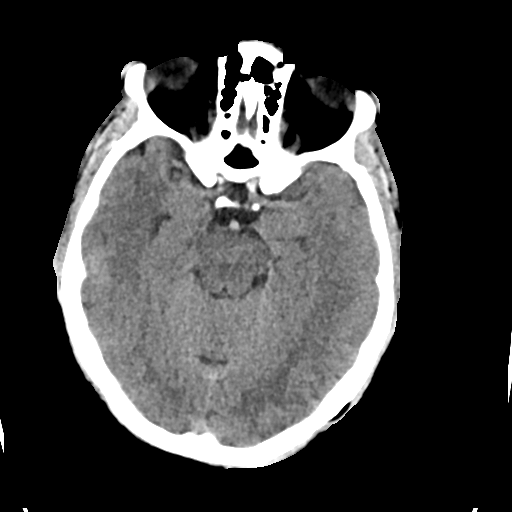
[im 21/31  brain]
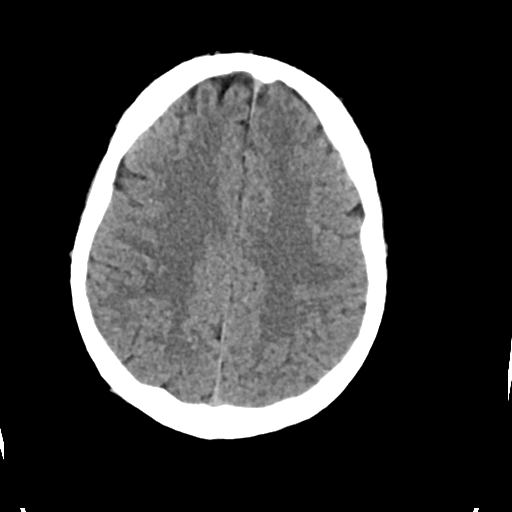

[Series 10: sag soft · sagittal · 0.32mm/px · 2 of 55 slices shown]
[im 19/55  brain]
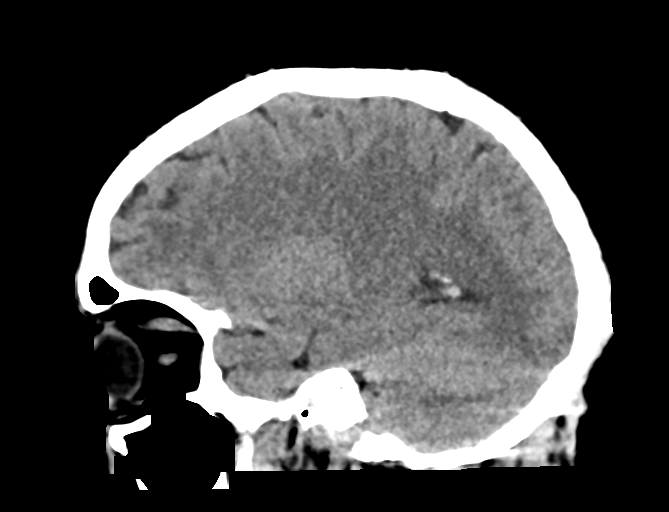
[im 37/55  brain]
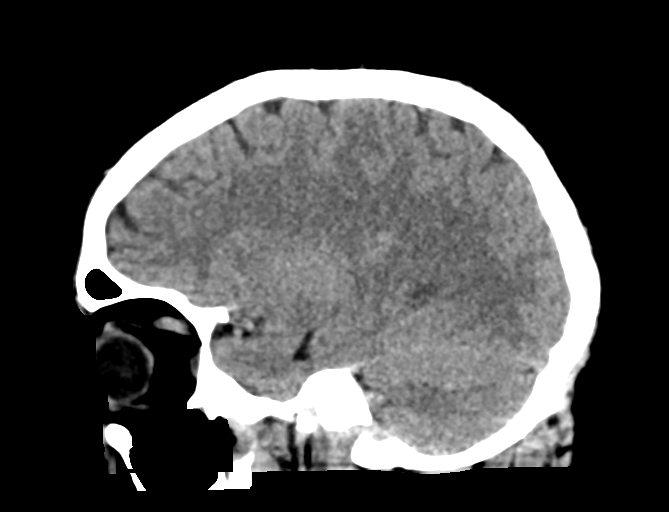

[17 of 47 positions shown; findings below may reference images not displayed]

FINDINGS: Despite efforts by the technologist and patient, motion artifact is
present on today's exam and could not be eliminated. This reduces
exam sensitivity and specificity. The technologist reports the
patient was asleep for this exam. The exam was performed twice to
reduce motion.

Brain: The ventricles are normal in size and configuration. The
basilar cisterns are patent.

No mass, mass effect, or midline shift.

No acute intracranial hemorrhage is seen.

No abnormal extra-axial fluid collection.

Preservation of the normal cortical gray-white interface without CT
evidence of an acute major vascular territorial cortical based
infarction.

Vascular: No hyperdense vessel or unexpected calcification.

Skull: Normal. Negative for fracture or focal lesion.

Sinuses/Orbits: The visualized orbits are unremarkable. The
visualized paranasal sinuses are clear. There is mild mucosal
opacification of a few bilateral ethmoid air cells. The calvarium is
intact.

Other: None.
IMPRESSION: No acute intracranial process.

## 2022-02-01 IMAGING — CR DG CHEST 2V
2 series · 2 of 2 positions shown · non-contrast
Comparison: 04/26/2019

CLINICAL DATA: Seizure-like activity

EXAM:
CHEST - 2 VIEW

[chest lat]
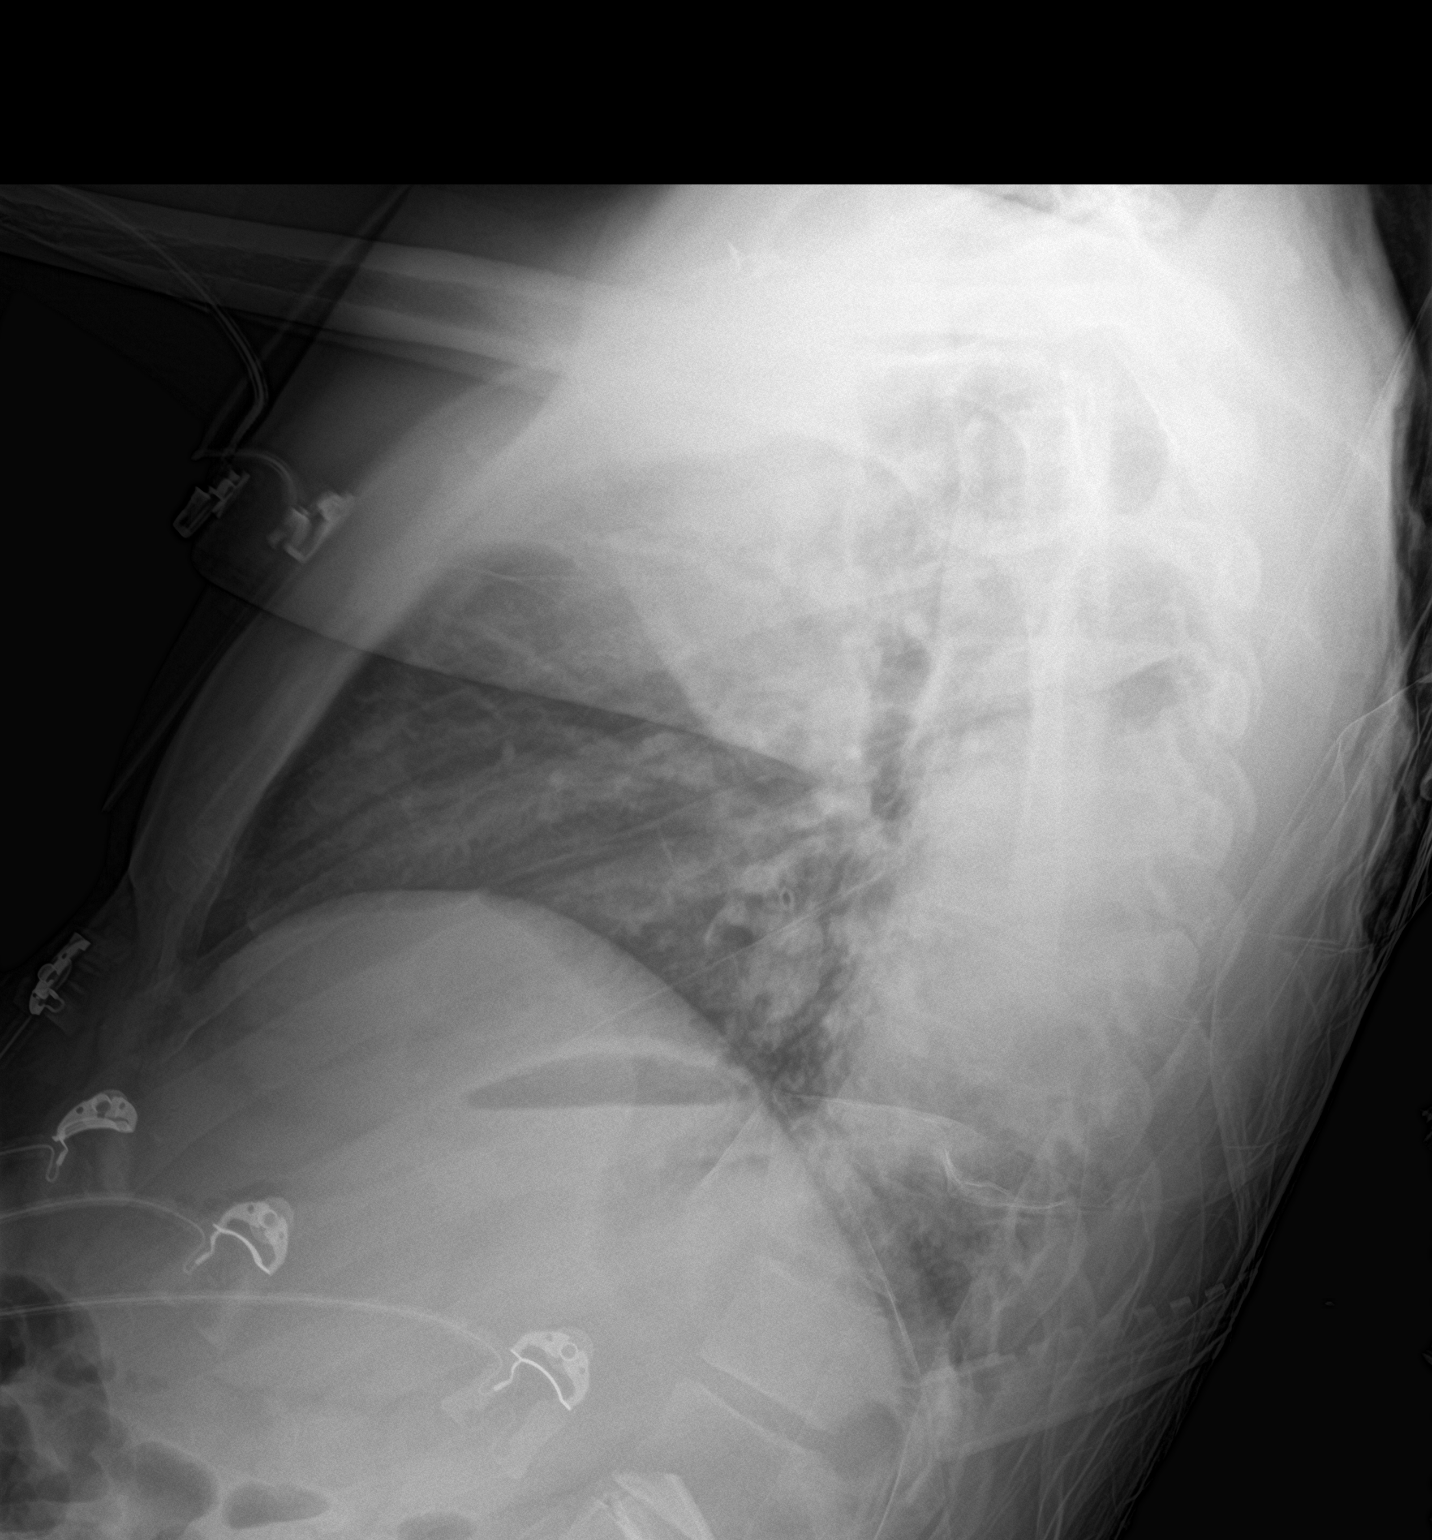

[chest ap]
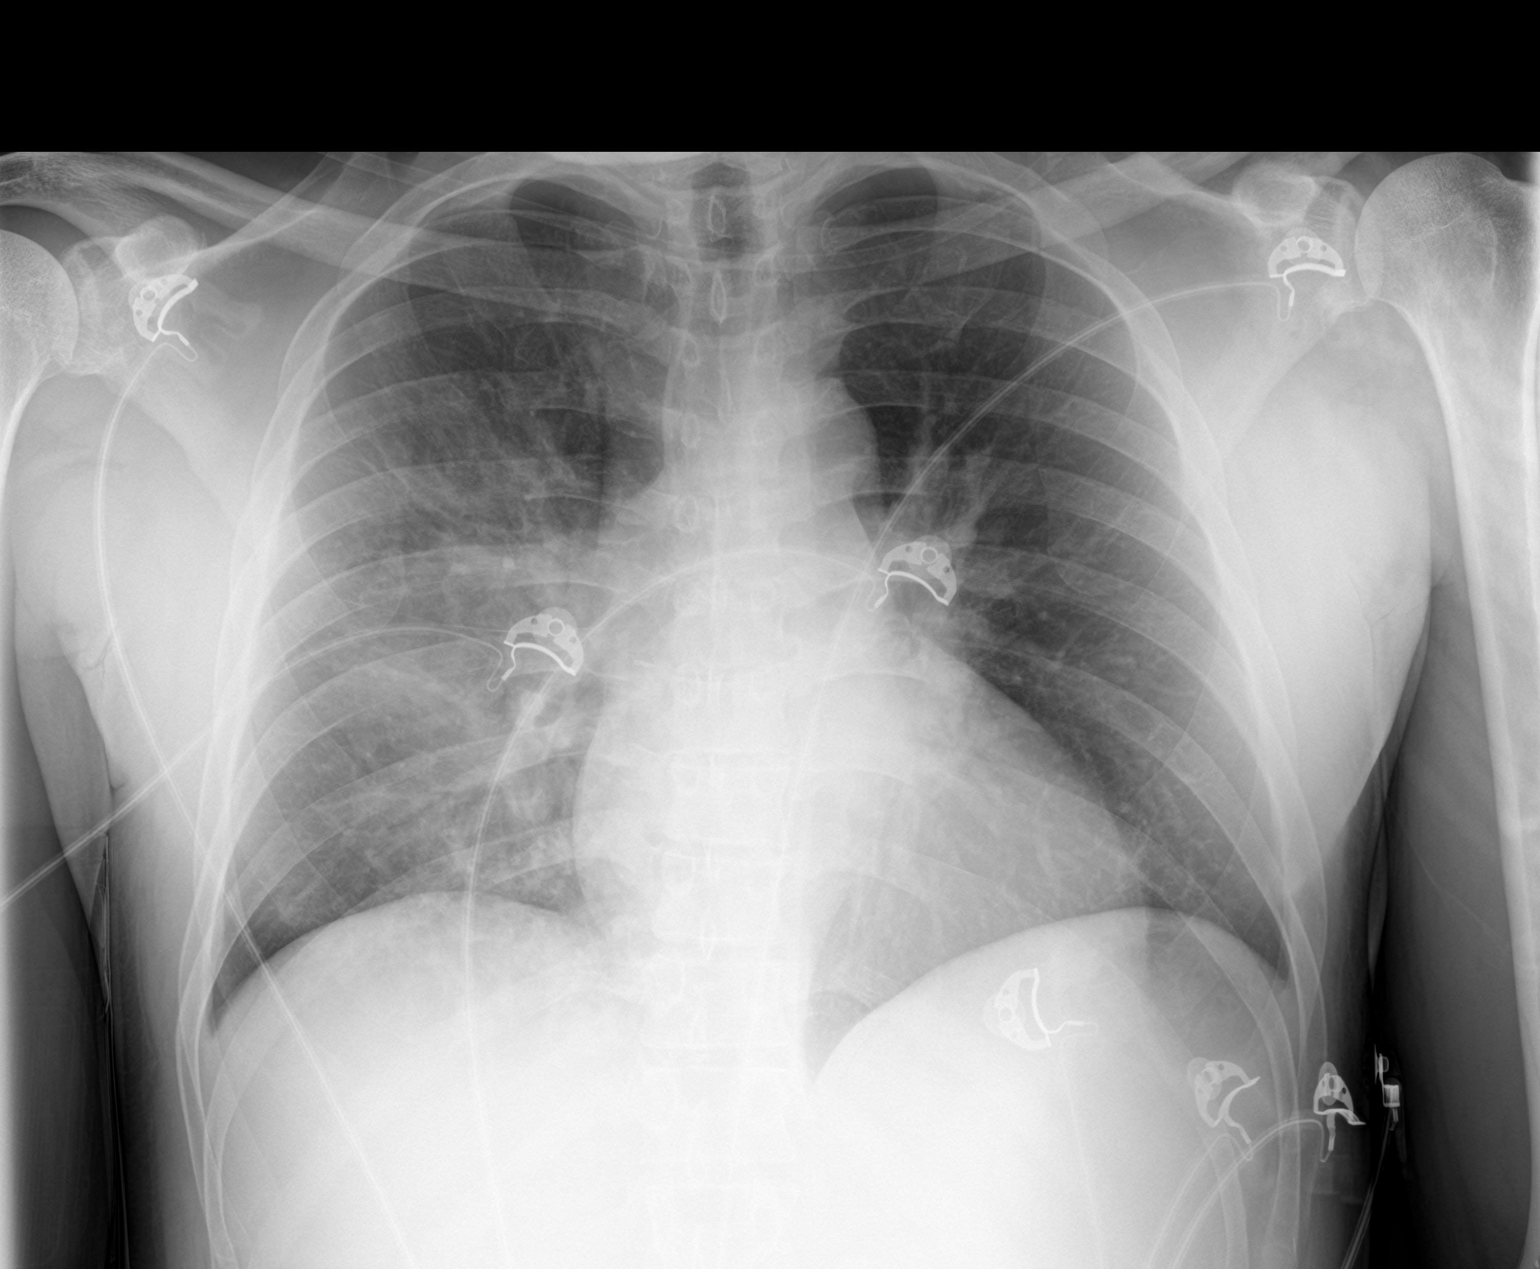

[2 of 2 positions shown; findings below may reference images not displayed]

FINDINGS: Normal heart size, mediastinal contours, and pulmonary vascularity.

RIGHT lower lobe infiltrate which could represent pneumonia or
aspiration.

Questionable LEFT infrahilar opacity.

No pleural effusion or pneumothorax.

Osseous structures unremarkable.
IMPRESSION: RIGHT lower lobe pneumonia versus aspiration.

Question of mild LEFT infrahilar infiltrate.

## 2022-11-23 DEATH — deceased
